# Patient Record
Sex: Female | Born: 1955 | Race: White | Hispanic: No | Marital: Married | Smoking: Never smoker
Health system: Southern US, Community
[De-identification: ages and names within clinical notes are randomized; demographics above are authoritative.]

## PROBLEM LIST (undated history)

## (undated) DIAGNOSIS — I341 Nonrheumatic mitral (valve) prolapse: Secondary | ICD-10-CM

## (undated) DIAGNOSIS — Z8619 Personal history of other infectious and parasitic diseases: Secondary | ICD-10-CM

## (undated) DIAGNOSIS — T7840XA Allergy, unspecified, initial encounter: Secondary | ICD-10-CM

## (undated) DIAGNOSIS — E785 Hyperlipidemia, unspecified: Secondary | ICD-10-CM

## (undated) DIAGNOSIS — I480 Paroxysmal atrial fibrillation: Secondary | ICD-10-CM

## (undated) DIAGNOSIS — M810 Age-related osteoporosis without current pathological fracture: Secondary | ICD-10-CM

## (undated) DIAGNOSIS — Z87440 Personal history of urinary (tract) infections: Secondary | ICD-10-CM

## (undated) DIAGNOSIS — Z124 Encounter for screening for malignant neoplasm of cervix: Secondary | ICD-10-CM

## (undated) DIAGNOSIS — E559 Vitamin D deficiency, unspecified: Secondary | ICD-10-CM

## (undated) DIAGNOSIS — I4891 Unspecified atrial fibrillation: Secondary | ICD-10-CM

## (undated) DIAGNOSIS — I77811 Abdominal aortic ectasia: Secondary | ICD-10-CM

## (undated) DIAGNOSIS — C44310 Basal cell carcinoma of skin of unspecified parts of face: Secondary | ICD-10-CM

## (undated) DIAGNOSIS — Z0000 Encounter for general adult medical examination without abnormal findings: Secondary | ICD-10-CM

## (undated) HISTORY — DX: Hyperlipidemia, unspecified: E78.5

## (undated) HISTORY — DX: Nonrheumatic mitral (valve) prolapse: I34.1

## (undated) HISTORY — PX: TONSILLECTOMY: SHX5217

## (undated) HISTORY — DX: Age-related osteoporosis without current pathological fracture: M81.0

## (undated) HISTORY — DX: Basal cell carcinoma of skin of unspecified parts of face: C44.310

## (undated) HISTORY — DX: Encounter for general adult medical examination without abnormal findings: Z00.00

## (undated) HISTORY — DX: Allergy, unspecified, initial encounter: T78.40XA

## (undated) HISTORY — DX: Abdominal aortic ectasia: I77.811

## (undated) HISTORY — DX: Personal history of other infectious and parasitic diseases: Z86.19

## (undated) HISTORY — DX: Morbid (severe) obesity due to excess calories: E66.01

## (undated) HISTORY — DX: Vitamin D deficiency, unspecified: E55.9

## (undated) HISTORY — DX: Personal history of urinary (tract) infections: Z87.440

## (undated) HISTORY — DX: Unspecified atrial fibrillation: I48.91

## (undated) HISTORY — DX: Paroxysmal atrial fibrillation: I48.0

## (undated) HISTORY — DX: Encounter for screening for malignant neoplasm of cervix: Z12.4

---

## 2014-07-05 ENCOUNTER — Other Ambulatory Visit: Payer: Self-pay | Admitting: Family Medicine

## 2014-07-05 DIAGNOSIS — Z1231 Encounter for screening mammogram for malignant neoplasm of breast: Secondary | ICD-10-CM

## 2014-07-09 ENCOUNTER — Ambulatory Visit: Payer: Self-pay | Admitting: Physician Assistant

## 2014-07-16 ENCOUNTER — Ambulatory Visit: Payer: Self-pay | Admitting: Physician Assistant

## 2014-07-16 ENCOUNTER — Ambulatory Visit (HOSPITAL_BASED_OUTPATIENT_CLINIC_OR_DEPARTMENT_OTHER): Payer: Self-pay

## 2014-07-30 ENCOUNTER — Ambulatory Visit (HOSPITAL_BASED_OUTPATIENT_CLINIC_OR_DEPARTMENT_OTHER)
Admission: RE | Admit: 2014-07-30 | Discharge: 2014-07-30 | Disposition: A | Discharge status: Home / Self Care | Payer: 59 | Source: Ambulatory Visit | Attending: Family Medicine | Admitting: Family Medicine

## 2014-07-30 DIAGNOSIS — Z1231 Encounter for screening mammogram for malignant neoplasm of breast: Secondary | ICD-10-CM | POA: Diagnosis present

## 2014-08-20 ENCOUNTER — Ambulatory Visit (INDEPENDENT_AMBULATORY_CARE_PROVIDER_SITE_OTHER): Payer: 59 | Admitting: Medical

## 2014-08-20 ENCOUNTER — Encounter: Payer: Self-pay | Admitting: Medical

## 2014-08-20 VITALS — BP 125/81 | HR 88 | Temp 98.1°F | Ht 63.5 in | Wt 110.6 lb

## 2014-08-20 DIAGNOSIS — J0100 Acute maxillary sinusitis, unspecified: Secondary | ICD-10-CM | POA: Diagnosis not present

## 2014-08-20 MED ORDER — CEFDINIR 300 MG PO CAPS: 300.0000 mg | ORAL_CAPSULE | Freq: Two times a day (BID) | ORAL | Status: DC

## 2014-08-20 MED ORDER — BENZONATATE 100 MG PO CAPS: 100.0000 mg | ORAL_CAPSULE | Freq: Three times a day (TID) | ORAL | Status: DC | PRN

## 2014-08-20 NOTE — Assessment & Plan Note (Signed)
Your appear to have a sinus infection following allergic rhinitis symptoms. I am prescribing antibiotic cefdinir  for the infection. To help with the nasal congestion continue flonase nasal steroid. For your associated cough, I prescribed cough medicine benzonatate.  Rest, hydrate, tylenol for fever.  Follow up in 7 days or as needed.

## 2014-08-20 NOTE — Progress Notes (Signed)
Subjective:    Patient ID: Catherine Haynes, female    DOB: August 27, 1955, 59 y.o.   MRN: [id number]  HPI  Pt has new patient appointment with Dr. Randel Pigg in May. They gave appointment today for acute visit.  Pt states no chronic medical problems for which she takes meds daily. Only mild elevated lipids in the past. Pt states tonsillectomy and c section only.  Pt in states nasal congestion for 2-3 weeks. Then this week low grade fever with sinus pressure. Some sneezing and itching eyes a lot on and off past 2 years. Sneezing and itching eyes fall and winter.Pt gets occasional sinus infections in the past.  Clean houses, exercise walks 5 days a week, non smoker, Married- 2 children.  Review of Systems  Constitutional: Positive for fever and chills. Negative for fatigue.  HENT: Positive for congestion, postnasal drip and sinus pressure. Negative for ear pain, sneezing, sore throat and trouble swallowing.   Respiratory: Negative for cough, chest tightness, wheezing and stridor.   Cardiovascular: Negative for chest pain and palpitations.  Musculoskeletal: Negative for back pain.  Neurological: Negative for dizziness, seizures, speech difficulty, light-headedness, numbness and headaches.  Hematological: Negative for adenopathy. Does not bruise/bleed easily.   Past Medical History  Diagnosis Date  . Allergy   . Hyperlipidemia   . Mitral valve prolapse     History   Social History  . Marital Status: Married    Spouse Name: N/A  . Number of Children: N/A  . Years of Education: N/A   Occupational History  . Not on file.   Social History Main Topics  . Smoking status: Never Smoker   . Smokeless tobacco: Never Used  . Alcohol Use: 0.0 oz/week    0 Standard drinks or equivalent per week     Comment: social.  . Drug Use: Not on file  . Sexual Activity: Not on file   Other Topics Concern  . Not on file   Social History Narrative  . No narrative on file    Past Surgical History   Procedure Laterality Date  . Cesarean section      Family History  Problem Relation Age of Onset  . Stroke Mother   . Hypertension Mother   . Stroke Father   . Hypertension Father   . Diabetes Father     No Known Allergies  No current outpatient prescriptions on file prior to visit.   No current facility-administered medications on file prior to visit.    BP 125/81 mmHg  Pulse 88  Temp(Src) 98.1 F (36.7 C) (Oral)  Ht 5' 3.5" (1.613 m)  Wt 110 lb 9.6 oz (50.168 kg)  BMI 19.28 kg/m2  SpO2 100%      Objective:   Physical Exam  General  Mental Status - Alert. General Appearance - Well groomed. Not in acute distress.  Skin Rashes- No Rashes.  HEENT Head- Normal. Ear Auditory Canal - Left- Normal. Right - Normal.Tympanic Membrane- Left- Normal. Right- Normal. Eye Sclera/Conjunctiva- Left- Normal. Right- Normal. Nose & Sinuses Nasal Mucosa- Left-  Boggy and Congested. Right-  Boggy and  Congested.Bilateral maxillary and frontal sinus pressure. Mouth & Throat Lips: Upper Lip- Normal: no dryness, cracking, pallor, cyanosis, or vesicular eruption. Lower Lip-Normal: no dryness, cracking, pallor, cyanosis or vesicular eruption. Buccal Mucosa- Bilateral- No Aphthous ulcers. Oropharynx- No Discharge or Erythema. Tonsils: Characteristics- Bilateral- No Erythema or Congestion. Size/Enlargement- Bilateral- No enlargement. Discharge- bilateral-None.  Neck Neck- Supple. No Masses.   Chest and Lung Exam  Auscultation: Breath Sounds:-Clear even and unlabored.  Cardiovascular Auscultation:Rythm- Regular, rate and rhythm. Murmurs & Other Heart Sounds:Ausculatation of the heart reveal- No Murmurs.  Lymphatic Head & Neck General Head & Neck Lymphatics: Bilateral: Description- No Localized lymphadenopathy.       Assessment & Plan:

## 2014-08-20 NOTE — Progress Notes (Signed)
Pre visit review using our clinic review tool, if applicable. No additional management support is needed unless otherwise documented below in the visit note. 

## 2014-08-20 NOTE — Patient Instructions (Signed)
Sinusitis, acute maxillary Your appear to have a sinus infection following allergic rhinitis symptoms. I am prescribing antibiotic cefdinir  for the infection. To help with the nasal congestion continue flonase nasal steroid. For your associated cough, I prescribed cough medicine benzonatate.  Rest, hydrate, tylenol for fever.  Follow up in 7 days or as needed.

## 2014-11-09 ENCOUNTER — Ambulatory Visit: Payer: Self-pay | Admitting: Family Medicine

## 2014-11-30 ENCOUNTER — Ambulatory Visit (INDEPENDENT_AMBULATORY_CARE_PROVIDER_SITE_OTHER): Payer: 59 | Admitting: Family Medicine

## 2014-11-30 ENCOUNTER — Encounter: Payer: Self-pay | Admitting: Family Medicine

## 2014-11-30 VITALS — BP 110/64 | HR 84 | Temp 98.1°F | Ht 63.0 in | Wt 111.5 lb

## 2014-11-30 DIAGNOSIS — E785 Hyperlipidemia, unspecified: Secondary | ICD-10-CM

## 2014-11-30 DIAGNOSIS — Z780 Asymptomatic menopausal state: Secondary | ICD-10-CM

## 2014-11-30 DIAGNOSIS — Z0000 Encounter for general adult medical examination without abnormal findings: Secondary | ICD-10-CM | POA: Diagnosis not present

## 2014-11-30 DIAGNOSIS — C44310 Basal cell carcinoma of skin of unspecified parts of face: Secondary | ICD-10-CM

## 2014-11-30 DIAGNOSIS — J0100 Acute maxillary sinusitis, unspecified: Secondary | ICD-10-CM

## 2014-11-30 DIAGNOSIS — M810 Age-related osteoporosis without current pathological fracture: Secondary | ICD-10-CM

## 2014-11-30 DIAGNOSIS — Z1211 Encounter for screening for malignant neoplasm of colon: Secondary | ICD-10-CM | POA: Diagnosis not present

## 2014-11-30 LAB — COMPREHENSIVE METABOLIC PANEL
ALT: 17 U/L (ref 0–35)
AST: 18 U/L (ref 0–37)
Albumin: 4.6 g/dL (ref 3.5–5.2)
Alkaline Phosphatase: 69 U/L (ref 39–117)
BUN: 9 mg/dL (ref 6–23)
CO2: 29 mEq/L (ref 19–32)
Calcium: 9.1 mg/dL (ref 8.4–10.5)
Chloride: 104 mEq/L (ref 96–112)
Creatinine, Ser: 0.55 mg/dL (ref 0.40–1.20)
GFR: 121.27 mL/min (ref >60.00)
Glucose, Bld: 88 mg/dL (ref 70–99)
Potassium: 3.7 mEq/L (ref 3.5–5.1)
Sodium: 138 mEq/L (ref 135–145)
Total Bilirubin: 0.4 mg/dL (ref 0.2–1.2)
Total Protein: 7.1 g/dL (ref 6.0–8.3)

## 2014-11-30 LAB — CBC
HCT: 41.5 % (ref 36.0–46.0)
Hemoglobin: 13.8 g/dL (ref 12.0–15.0)
MCHC: 33.3 g/dL (ref 30.0–36.0)
MCV: 91.1 fl (ref 78.0–100.0)
Platelets: 276 10*3/uL (ref 150.0–400.0)
RBC: 4.56 Mil/uL (ref 3.87–5.11)
RDW: 13.4 % (ref 11.5–15.5)
WBC: 5.6 10*3/uL (ref 4.0–10.5)

## 2014-11-30 LAB — LIPID PANEL
Cholesterol: 206 mg/dL — ABNORMAL HIGH (ref 0–200)
HDL: 83.4 mg/dL (ref >39.00)
LDL Cholesterol: 110 mg/dL — ABNORMAL HIGH (ref 0–99)
NonHDL: 122.6
Total CHOL/HDL Ratio: 2
Triglycerides: 61 mg/dL (ref 0.0–149.0)
VLDL: 12.2 mg/dL (ref 0.0–40.0)

## 2014-11-30 LAB — TSH: TSH: 1.93 u[IU]/mL (ref 0.35–4.50)

## 2014-11-30 NOTE — Progress Notes (Signed)
Pre visit review using our clinic review tool, if applicable. No additional management support is needed unless otherwise documented below in the visit note. 

## 2014-11-30 NOTE — Progress Notes (Signed)
Tenaya Hilyer  [id number] 19-Jun-1956 11/30/2014      Progress Note-Follow Up  Subjective  Chief Complaint  Chief Complaint  Patient presents with  . Establish Care    HPI  Patient is a 59 y.o. female in today for routine medical care. Patient is in today to establish care. She is recently been treated for sinus infection with good results. No congestion or fever. She generally feels well. Does report having a basal cell carcinoma on her face but no new or recent concerns. With the menopause at roughly 59 years of age. Follows with [facility]. Has not had a Pap since her kids were born in her daughter's 49. Has not had a colonoscopy thus far. No recent illness. Denies CP/palp/SOB/HA/congestion/fevers/GI or GU c/o. Taking meds as prescribed  Past Medical History  Diagnosis Date  . Allergy   . Hyperlipidemia   . Mitral valve prolapse     Past Surgical History  Procedure Laterality Date  . Cesarean section      Family History  Problem Relation Age of Onset  . Stroke Mother   . Hypertension Mother   . Stroke Father   . Hypertension Father   . Diabetes Father     History   Social History  . Marital Status: Married    Spouse Name: N/A  . Number of Children: N/A  . Years of Education: N/A   Occupational History  . clean houses     Social History Main Topics  . Smoking status: Never Smoker   . Smokeless tobacco: Never Used  . Alcohol Use: 0.0 oz/week    0 Standard drinks or equivalent per week     Comment: social.  . Drug Use: No  . Sexual Activity: Not on file   Other Topics Concern  . Not on file   Social History Narrative    No current outpatient prescriptions on file prior to visit.   No current facility-administered medications on file prior to visit.    No Known Allergies  Review of Systems  Review of Systems  Constitutional: Negative for fever, chills and malaise/fatigue.  HENT: Negative for congestion, hearing loss and  nosebleeds.   Eyes: Negative for discharge.  Respiratory: Negative for cough, sputum production, shortness of breath and wheezing.   Cardiovascular: Negative for chest pain, palpitations and leg swelling.  Gastrointestinal: Negative for heartburn, nausea, vomiting, abdominal pain, diarrhea, constipation and blood in stool.  Genitourinary: Negative for dysuria, urgency, frequency and hematuria.  Musculoskeletal: Negative for myalgias, back pain and falls.  Skin: Negative for rash.  Neurological: Negative for dizziness, tremors, sensory change, focal weakness, loss of consciousness, weakness and headaches.  Endo/Heme/Allergies: Negative for polydipsia. Does not bruise/bleed easily.  Psychiatric/Behavioral: Negative for depression and suicidal ideas. The patient is not nervous/anxious and does not have insomnia.     Objective  BP 110/64 mmHg  Pulse 84  Temp(Src) 98.1 F (36.7 C) (Oral)  Ht 5\' 3"  (1.6 m)  Wt 111 lb 8 oz (50.576 kg)  BMI 19.76 kg/m2  SpO2 97%  Physical Exam  Physical Exam  Constitutional: She is oriented to person, place, and time and well-developed, well-nourished, and in no distress. No distress.  HENT:  Head: Normocephalic and atraumatic.  Right Ear: External ear normal.  Left Ear: External ear normal.  Nose: Nose normal.  Mouth/Throat: Oropharynx is clear and moist. No oropharyngeal exudate.  Eyes: Conjunctivae are normal. Pupils are equal, round, and reactive to light. Right eye exhibits no discharge. Left  eye exhibits no discharge. No scleral icterus.  Neck: Normal range of motion. Neck supple. No thyromegaly present.  Cardiovascular: Normal rate, regular rhythm, normal heart sounds and intact distal pulses.   No murmur heard. Pulmonary/Chest: Effort normal and breath sounds normal. No respiratory distress. She has no wheezes. She has no rales.  Abdominal: Soft. Bowel sounds are normal. She exhibits no distension and no mass. There is no tenderness.    Musculoskeletal: Normal range of motion. She exhibits no edema or tenderness.  Lymphadenopathy:    She has no cervical adenopathy.  Neurological: She is alert and oriented to person, place, and time. She has normal reflexes. No cranial nerve deficit. Coordination normal.  Skin: Skin is warm and dry. No rash noted. She is not diaphoretic.  Psychiatric: Mood, memory and affect normal.    Assessment & Plan  Hx: UTI (urinary tract infection) No concerns recently  Hyperlipidemia Tolerating statin, encouraged heart healthy diet, avoid trans fats, minimize simple carbs and saturated fats. Increase exercise as tolerated  Osteoporosis Dexa scan confirms osteoporosis, encouraged citracal bid and/or 3 servings of dairy daily. Start Vitamin D 2000 IU daily and check vitamin D level with next blood draw. incresae exercise.   Colon cancer screening Referred to gastroenterology for screening colonoscopy  Sinusitis, acute maxillary Good response to recent course of antibiotics.

## 2014-11-30 NOTE — Patient Instructions (Addendum)
Cranberry tabs and probiotics such as Digestive Advantage, Hardin Negus colon health or online at [email or web address]. 10 strain Encouraged increased rest and hydration, add probiotics, zinc such as capsule at 50 mg, Coldeze or Xicam. Treat fevers as needed 64 oz clear fluids  Elderberry liquid or caps  Vitamin d 2000 IU daily   Preventive Care for Adults A healthy lifestyle and preventive care can promote health and wellness. Preventive health guidelines for women include the following key practices.  A routine yearly physical is a good way to check with your health care provider about your health and preventive screening. It is a chance to share any concerns and updates on your health and to receive a thorough exam.  Visit your dentist for a routine exam and preventive care every 6 months. Brush your teeth twice a day and floss once a day. Good oral hygiene prevents tooth decay and gum disease.  The frequency of eye exams is based on your age, health, family medical history, use of contact lenses, and other factors. Follow your health care provider's recommendations for frequency of eye exams.  Eat a healthy diet. Foods like vegetables, fruits, whole grains, low-fat dairy products, and lean protein foods contain the nutrients you need without too many calories. Decrease your intake of foods high in solid fats, added sugars, and salt. Eat the right amount of calories for you.Get information about a proper diet from your health care provider, if necessary.  Regular physical exercise is one of the most important things you can do for your health. Most adults should get at least 150 minutes of moderate-intensity exercise (any activity that increases your heart rate and causes you to sweat) each week. In addition, most adults need muscle-strengthening exercises on 2 or more days a week.  Maintain a healthy weight. The body mass index (BMI) is a screening tool to identify possible weight  problems. It provides an estimate of body fat based on height and weight. Your health care provider can find your BMI and can help you achieve or maintain a healthy weight.For adults 20 years and older:  A BMI below 18.5 is considered underweight.  A BMI of 18.5 to 24.9 is normal.  A BMI of 25 to 29.9 is considered overweight.  A BMI of 30 and above is considered obese.  Maintain normal blood lipids and cholesterol levels by exercising and minimizing your intake of saturated fat. Eat a balanced diet with plenty of fruit and vegetables. Blood tests for lipids and cholesterol should begin at age 64 and be repeated every 5 years. If your lipid or cholesterol levels are high, you are over 50, or you are at high risk for heart disease, you may need your cholesterol levels checked more frequently.Ongoing high lipid and cholesterol levels should be treated with medicines if diet and exercise are not working.  If you smoke, find out from your health care provider how to quit. If you do not use tobacco, do not start.  Lung cancer screening is recommended for adults aged 32-80 years who are at high risk for developing lung cancer because of a history of smoking. A yearly low-dose CT scan of the lungs is recommended for people who have at least a 30-pack-year history of smoking and are a current smoker or have quit within the past 15 years. A pack year of smoking is smoking an average of 1 pack of cigarettes a day for 1 year (for example: 1 pack a day for 30 years  or 2 packs a day for 15 years). Yearly screening should continue until the smoker has stopped smoking for at least 15 years. Yearly screening should be stopped for people who develop a health problem that would prevent them from having lung cancer treatment.  If you are pregnant, do not drink alcohol. If you are breastfeeding, be very cautious about drinking alcohol. If you are not pregnant and choose to drink alcohol, do not have more than 1 drink  per day. One drink is considered to be 12 ounces (355 mL) of beer, 5 ounces (148 mL) of wine, or 1.5 ounces (44 mL) of liquor.  Avoid use of street drugs. Do not share needles with anyone. Ask for help if you need support or instructions about stopping the use of drugs.  High blood pressure causes heart disease and increases the risk of stroke. Your blood pressure should be checked at least every 1 to 2 years. Ongoing high blood pressure should be treated with medicines if weight loss and exercise do not work.  If you are 39-64 years old, ask your health care provider if you should take aspirin to prevent strokes.  Diabetes screening involves taking a blood sample to check your fasting blood sugar level. This should be done once every 3 years, after age 3, if you are within normal weight and without risk factors for diabetes. Testing should be considered at a younger age or be carried out more frequently if you are overweight and have at least 1 risk factor for diabetes.  Breast cancer screening is essential preventive care for women. You should practice "breast self-awareness." This means understanding the normal appearance and feel of your breasts and may include breast self-examination. Any changes detected, no matter how small, should be reported to a health care provider. Women in their 12s and 30s should have a clinical breast exam (CBE) by a health care provider as part of a regular health exam every 1 to 3 years. After age 43, women should have a CBE every year. Starting at age 6, women should consider having a mammogram (breast X-ray test) every year. Women who have a family history of breast cancer should talk to their health care provider about genetic screening. Women at a high risk of breast cancer should talk to their health care providers about having an MRI and a mammogram every year.  Breast cancer gene (BRCA)-related cancer risk assessment is recommended for women who have family  members with BRCA-related cancers. BRCA-related cancers include breast, ovarian, tubal, and peritoneal cancers. Having family members with these cancers may be associated with an increased risk for harmful changes (mutations) in the breast cancer genes BRCA1 and BRCA2. Results of the assessment will determine the need for genetic counseling and BRCA1 and BRCA2 testing.  Routine pelvic exams to screen for cancer are no longer recommended for nonpregnant women who are considered low risk for cancer of the pelvic organs (ovaries, uterus, and vagina) and who do not have symptoms. Ask your health care provider if a screening pelvic exam is right for you.  If you have had past treatment for cervical cancer or a condition that could lead to cancer, you need Pap tests and screening for cancer for at least 20 years after your treatment. If Pap tests have been discontinued, your risk factors (such as having a new sexual partner) need to be reassessed to determine if screening should be resumed. Some women have medical problems that increase the chance of getting cervical  cancer. In these cases, your health care provider may recommend more frequent screening and Pap tests.  The HPV test is an additional test that may be used for cervical cancer screening. The HPV test looks for the virus that can cause the cell changes on the cervix. The cells collected during the Pap test can be tested for HPV. The HPV test could be used to screen women aged 76 years and older, and should be used in women of any age who have unclear Pap test results. After the age of 40, women should have HPV testing at the same frequency as a Pap test.  Colorectal cancer can be detected and often prevented. Most routine colorectal cancer screening begins at the age of 31 years and continues through age 85 years. However, your health care provider may recommend screening at an earlier age if you have risk factors for colon cancer. On a yearly basis,  your health care provider may provide home test kits to check for hidden blood in the stool. Use of a small camera at the end of a tube, to directly examine the colon (sigmoidoscopy or colonoscopy), can detect the earliest forms of colorectal cancer. Talk to your health care provider about this at age 91, when routine screening begins. Direct exam of the colon should be repeated every 5-10 years through age 59 years, unless early forms of pre-cancerous polyps or small growths are found.  People who are at an increased risk for hepatitis B should be screened for this virus. You are considered at high risk for hepatitis B if:  You were born in a country where hepatitis B occurs often. Talk with your health care provider about which countries are considered high risk.  Your parents were born in a high-risk country and you have not received a shot to protect against hepatitis B (hepatitis B vaccine).  You have HIV or AIDS.  You use needles to inject street drugs.  You live with, or have sex with, someone who has hepatitis B.  You get hemodialysis treatment.  You take certain medicines for conditions like cancer, organ transplantation, and autoimmune conditions.  Hepatitis C blood testing is recommended for all people born from [birth year] through 1963 and any individual with known risks for hepatitis C.  Practice safe sex. Use condoms and avoid high-risk sexual practices to reduce the spread of sexually transmitted infections (STIs). STIs include gonorrhea, chlamydia, syphilis, trichomonas, herpes, HPV, and human immunodeficiency virus (HIV). Herpes, HIV, and HPV are viral illnesses that have no cure. They can result in disability, cancer, and death.  You should be screened for sexually transmitted illnesses (STIs) including gonorrhea and chlamydia if:  You are sexually active and are younger than 24 years.  You are older than 24 years and your health care provider tells you that you are at risk for  this type of infection.  Your sexual activity has changed since you were last screened and you are at an increased risk for chlamydia or gonorrhea. Ask your health care provider if you are at risk.  If you are at risk of being infected with HIV, it is recommended that you take a prescription medicine daily to prevent HIV infection. This is called preexposure prophylaxis (PrEP). You are considered at risk if:  You are a heterosexual woman, are sexually active, and are at increased risk for HIV infection.  You take drugs by injection.  You are sexually active with a partner who has HIV.  Talk with your  health care provider about whether you are at high risk of being infected with HIV. If you choose to begin PrEP, you should first be tested for HIV. You should then be tested every 3 months for as long as you are taking PrEP.  Osteoporosis is a disease in which the bones lose minerals and strength with aging. This can result in serious bone fractures or breaks. The risk of osteoporosis can be identified using a bone density scan. Women ages 75 years and over and women at risk for fractures or osteoporosis should discuss screening with their health care providers. Ask your health care provider whether you should take a calcium supplement or vitamin D to reduce the rate of osteoporosis.  Menopause can be associated with physical symptoms and risks. Hormone replacement therapy is available to decrease symptoms and risks. You should talk to your health care provider about whether hormone replacement therapy is right for you.  Use sunscreen. Apply sunscreen liberally and repeatedly throughout the day. You should seek shade when your shadow is shorter than you. Protect yourself by wearing long sleeves, pants, a wide-brimmed hat, and sunglasses year round, whenever you are outdoors.  Once a month, do a whole body skin exam, using a mirror to look at the skin on your back. Tell your health care provider of  new moles, moles that have irregular borders, moles that are larger than a pencil eraser, or moles that have changed in shape or color.  Stay current with required vaccines (immunizations).  Influenza vaccine. All adults should be immunized every year.  Tetanus, diphtheria, and acellular pertussis (Td, Tdap) vaccine. Pregnant women should receive 1 dose of Tdap vaccine during each pregnancy. The dose should be obtained regardless of the length of time since the last dose. Immunization is preferred during the 27th-36th week of gestation. An adult who has not previously received Tdap or who does not know her vaccine status should receive 1 dose of Tdap. This initial dose should be followed by tetanus and diphtheria toxoids (Td) booster doses every 10 years. Adults with an unknown or incomplete history of completing a 3-dose immunization series with Td-containing vaccines should begin or complete a primary immunization series including a Tdap dose. Adults should receive a Td booster every 10 years.  Varicella vaccine. An adult without evidence of immunity to varicella should receive 2 doses or a second dose if she has previously received 1 dose. Pregnant females who do not have evidence of immunity should receive the first dose after pregnancy. This first dose should be obtained before leaving the health care facility. The second dose should be obtained 4-8 weeks after the first dose.  Human papillomavirus (HPV) vaccine. Females aged 13-26 years who have not received the vaccine previously should obtain the 3-dose series. The vaccine is not recommended for use in pregnant females. However, pregnancy testing is not needed before receiving a dose. If a female is found to be pregnant after receiving a dose, no treatment is needed. In that case, the remaining doses should be delayed until after the pregnancy. Immunization is recommended for any person with an immunocompromised condition through the age of 39  years if she did not get any or all doses earlier. During the 3-dose series, the second dose should be obtained 4-8 weeks after the first dose. The third dose should be obtained 24 weeks after the first dose and 16 weeks after the second dose.  Zoster vaccine. One dose is recommended for adults aged 71 years  or older unless certain conditions are present.  Measles, mumps, and rubella (MMR) vaccine. Adults born before 45 generally are considered immune to measles and mumps. Adults born in [birth year] or later should have 1 or more doses of MMR vaccine unless there is a contraindication to the vaccine or there is laboratory evidence of immunity to each of the three diseases. A routine second dose of MMR vaccine should be obtained at least 28 days after the first dose for students attending postsecondary schools, health care workers, or international travelers. People who received inactivated measles vaccine or an unknown type of measles vaccine during 1963-1967 should receive 2 doses of MMR vaccine. People who received inactivated mumps vaccine or an unknown type of mumps vaccine before 1979 and are at high risk for mumps infection should consider immunization with 2 doses of MMR vaccine. For females of childbearing age, rubella immunity should be determined. If there is no evidence of immunity, females who are not pregnant should be vaccinated. If there is no evidence of immunity, females who are pregnant should delay immunization until after pregnancy. Unvaccinated health care workers born before [birth year] who lack laboratory evidence of measles, mumps, or rubella immunity or laboratory confirmation of disease should consider measles and mumps immunization with 2 doses of MMR vaccine or rubella immunization with 1 dose of MMR vaccine.  Pneumococcal 13-valent conjugate (PCV13) vaccine. When indicated, a person who is uncertain of her immunization history and has no record of immunization should receive the PCV13 vaccine.  An adult aged 26 years or older who has certain medical conditions and has not been previously immunized should receive 1 dose of PCV13 vaccine. This PCV13 should be followed with a dose of pneumococcal polysaccharide (PPSV23) vaccine. The PPSV23 vaccine dose should be obtained at least 8 weeks after the dose of PCV13 vaccine. An adult aged 32 years or older who has certain medical conditions and previously received 1 or more doses of PPSV23 vaccine should receive 1 dose of PCV13. The PCV13 vaccine dose should be obtained 1 or more years after the last PPSV23 vaccine dose.  Pneumococcal polysaccharide (PPSV23) vaccine. When PCV13 is also indicated, PCV13 should be obtained first. All adults aged 15 years and older should be immunized. An adult younger than age 36 years who has certain medical conditions should be immunized. Any person who resides in a nursing home or long-term care facility should be immunized. An adult smoker should be immunized. People with an immunocompromised condition and certain other conditions should receive both PCV13 and PPSV23 vaccines. People with human immunodeficiency virus (HIV) infection should be immunized as soon as possible after diagnosis. Immunization during chemotherapy or radiation therapy should be avoided. Routine use of PPSV23 vaccine is not recommended for American Indians, [place] Natives, or people younger than 65 years unless there are medical conditions that require PPSV23 vaccine. When indicated, people who have unknown immunization and have no record of immunization should receive PPSV23 vaccine. One-time revaccination 5 years after the first dose of PPSV23 is recommended for people aged 19-64 years who have chronic kidney failure, nephrotic syndrome, asplenia, or immunocompromised conditions. People who received 1-2 doses of PPSV23 before age 34 years should receive another dose of PPSV23 vaccine at age 59 years or later if at least 5 years have passed since the  previous dose. Doses of PPSV23 are not needed for people immunized with PPSV23 at or after age 52 years.  Meningococcal vaccine. Adults with asplenia or persistent complement component deficiencies should receive  2 doses of quadrivalent meningococcal conjugate (MenACWY-D) vaccine. The doses should be obtained at least 2 months apart. Microbiologists working with certain meningococcal bacteria, [place] recruits, people at risk during an outbreak, and people who travel to or live in countries with a high rate of meningitis should be immunized. A first-year college student up through age 53 years who is living in a residence hall should receive a dose if she did not receive a dose on or after her 16th birthday. Adults who have certain high-risk conditions should receive one or more doses of vaccine.  Hepatitis A vaccine. Adults who wish to be protected from this disease, have certain high-risk conditions, work with hepatitis A-infected animals, work in hepatitis A research labs, or travel to or work in countries with a high rate of hepatitis A should be immunized. Adults who were previously unvaccinated and who anticipate close contact with an international adoptee during the first 60 days after arrival in the [place] from a country with a high rate of hepatitis A should be immunized.  Hepatitis B vaccine. Adults who wish to be protected from this disease, have certain high-risk conditions, may be exposed to blood or other infectious body fluids, are household contacts or sex partners of hepatitis B positive people, are clients or workers in certain care facilities, or travel to or work in countries with a high rate of hepatitis B should be immunized.  Haemophilus influenzae type b (Hib) vaccine. A previously unvaccinated person with asplenia or sickle cell disease or having a scheduled splenectomy should receive 1 dose of Hib vaccine. Regardless of previous immunization, a recipient of a hematopoietic  stem cell transplant should receive a 3-dose series 6-12 months after her successful transplant. Hib vaccine is not recommended for adults with HIV infection. Preventive Services / Frequency Ages 33 to 53 years  Blood pressure check.** / Every 1 to 2 years.  Lipid and cholesterol check.** / Every 5 years beginning at age 29.  Clinical breast exam.** / Every 3 years for women in their 90s and 31s.  BRCA-related cancer risk assessment.** / For women who have family members with a BRCA-related cancer (breast, ovarian, tubal, or peritoneal cancers).  Pap test.** / Every 2 years from ages 86 through 56. Every 3 years starting at age 33 through age 47 or 37 with a history of 3 consecutive normal Pap tests.  HPV screening.** / Every 3 years from ages 58 through ages 52 to 54 with a history of 3 consecutive normal Pap tests.  Hepatitis C blood test.** / For any individual with known risks for hepatitis C.  Skin self-exam. / Monthly.  Influenza vaccine. / Every year.  Tetanus, diphtheria, and acellular pertussis (Tdap, Td) vaccine.** / Consult your health care provider. Pregnant women should receive 1 dose of Tdap vaccine during each pregnancy. 1 dose of Td every 10 years.  Varicella vaccine.** / Consult your health care provider. Pregnant females who do not have evidence of immunity should receive the first dose after pregnancy.  HPV vaccine. / 3 doses over 6 months, if 30 and younger. The vaccine is not recommended for use in pregnant females. However, pregnancy testing is not needed before receiving a dose.  Measles, mumps, rubella (MMR) vaccine.** / You need at least 1 dose of MMR if you were born in 1957 or later. You may also need a 2nd dose. For females of childbearing age, rubella immunity should be determined. If there is no evidence of immunity, females who are  not pregnant should be vaccinated. If there is no evidence of immunity, females who are pregnant should delay immunization until  after pregnancy.  Pneumococcal 13-valent conjugate (PCV13) vaccine.** / Consult your health care provider.  Pneumococcal polysaccharide (PPSV23) vaccine.** / 1 to 2 doses if you smoke cigarettes or if you have certain conditions.  Meningococcal vaccine.** / 1 dose if you are age 40 to 44 years and a Market researcher living in a residence hall, or have one of several medical conditions, you need to get vaccinated against meningococcal disease. You may also need additional booster doses.  Hepatitis A vaccine.** / Consult your health care provider.  Hepatitis B vaccine.** / Consult your health care provider.  Haemophilus influenzae type b (Hib) vaccine.** / Consult your health care provider. Ages 53 to 84 years  Blood pressure check.** / Every 1 to 2 years.  Lipid and cholesterol check.** / Every 5 years beginning at age 75 years.  Lung cancer screening. / Every year if you are aged 22-80 years and have a 30-pack-year history of smoking and currently smoke or have quit within the past 15 years. Yearly screening is stopped once you have quit smoking for at least 15 years or develop a health problem that would prevent you from having lung cancer treatment.  Clinical breast exam.** / Every year after age 40 years.  BRCA-related cancer risk assessment.** / For women who have family members with a BRCA-related cancer (breast, ovarian, tubal, or peritoneal cancers).  Mammogram.** / Every year beginning at age 24 years and continuing for as long as you are in good health. Consult with your health care provider.  Pap test.** / Every 3 years starting at age 56 years through age 29 or 97 years with a history of 3 consecutive normal Pap tests.  HPV screening.** / Every 3 years from ages 21 years through ages 63 to 2 years with a history of 3 consecutive normal Pap tests.  Fecal occult blood test (FOBT) of stool. / Every year beginning at age 61 years and continuing until age 28 years.  You may not need to do this test if you get a colonoscopy every 10 years.  Flexible sigmoidoscopy or colonoscopy.** / Every 5 years for a flexible sigmoidoscopy or every 10 years for a colonoscopy beginning at age 28 years and continuing until age 47 years.  Hepatitis C blood test.** / For all people born from [birth year] through 1963 and any individual with known risks for hepatitis C.  Skin self-exam. / Monthly.  Influenza vaccine. / Every year.  Tetanus, diphtheria, and acellular pertussis (Tdap/Td) vaccine.** / Consult your health care provider. Pregnant women should receive 1 dose of Tdap vaccine during each pregnancy. 1 dose of Td every 10 years.  Varicella vaccine.** / Consult your health care provider. Pregnant females who do not have evidence of immunity should receive the first dose after pregnancy.  Zoster vaccine.** / 1 dose for adults aged 57 years or older.  Measles, mumps, rubella (MMR) vaccine.** / You need at least 1 dose of MMR if you were born in 1957 or later. You may also need a 2nd dose. For females of childbearing age, rubella immunity should be determined. If there is no evidence of immunity, females who are not pregnant should be vaccinated. If there is no evidence of immunity, females who are pregnant should delay immunization until after pregnancy.  Pneumococcal 13-valent conjugate (PCV13) vaccine.** / Consult your health care provider.  Pneumococcal polysaccharide (PPSV23) vaccine.** /  1 to 2 doses if you smoke cigarettes or if you have certain conditions.  Meningococcal vaccine.** / Consult your health care provider.  Hepatitis A vaccine.** / Consult your health care provider.  Hepatitis B vaccine.** / Consult your health care provider.  Haemophilus influenzae type b (Hib) vaccine.** / Consult your health care provider. Ages 58 years and over  Blood pressure check.** / Every 1 to 2 years.  Lipid and cholesterol check.** / Every 5 years beginning at age 55  years.  Lung cancer screening. / Every year if you are aged 2-80 years and have a 30-pack-year history of smoking and currently smoke or have quit within the past 15 years. Yearly screening is stopped once you have quit smoking for at least 15 years or develop a health problem that would prevent you from having lung cancer treatment.  Clinical breast exam.** / Every year after age 42 years.  BRCA-related cancer risk assessment.** / For women who have family members with a BRCA-related cancer (breast, ovarian, tubal, or peritoneal cancers).  Mammogram.** / Every year beginning at age 6 years and continuing for as long as you are in good health. Consult with your health care provider.  Pap test.** / Every 3 years starting at age 14 years through age 54 or 87 years with 3 consecutive normal Pap tests. Testing can be stopped between 65 and 70 years with 3 consecutive normal Pap tests and no abnormal Pap or HPV tests in the past 10 years.  HPV screening.** / Every 3 years from ages 66 years through ages 82 or 40 years with a history of 3 consecutive normal Pap tests. Testing can be stopped between 65 and 70 years with 3 consecutive normal Pap tests and no abnormal Pap or HPV tests in the past 10 years.  Fecal occult blood test (FOBT) of stool. / Every year beginning at age 63 years and continuing until age 58 years. You may not need to do this test if you get a colonoscopy every 10 years.  Flexible sigmoidoscopy or colonoscopy.** / Every 5 years for a flexible sigmoidoscopy or every 10 years for a colonoscopy beginning at age 36 years and continuing until age 52 years.  Hepatitis C blood test.** / For all people born from [birth year] through 1963 and any individual with known risks for hepatitis C.  Osteoporosis screening.** / A one-time screening for women ages 21 years and over and women at risk for fractures or osteoporosis.  Skin self-exam. / Monthly.  Influenza vaccine. / Every year.  Tetanus,  diphtheria, and acellular pertussis (Tdap/Td) vaccine.** / 1 dose of Td every 10 years.  Varicella vaccine.** / Consult your health care provider.  Zoster vaccine.** / 1 dose for adults aged 82 years or older.  Pneumococcal 13-valent conjugate (PCV13) vaccine.** / Consult your health care provider.  Pneumococcal polysaccharide (PPSV23) vaccine.** / 1 dose for all adults aged 38 years and older.  Meningococcal vaccine.** / Consult your health care provider.  Hepatitis A vaccine.** / Consult your health care provider.  Hepatitis B vaccine.** / Consult your health care provider.  Haemophilus influenzae type b (Hib) vaccine.** / Consult your health care provider. ** Family history and personal history of risk and conditions may change your health care provider's recommendations. Document Released: 08/07/2001 Document Revised: 10/26/2013 Document Reviewed: 11/06/2010 [facility] Patient Information 2015 [place], [place]. This information is not intended to replace advice given to you by your health care provider. Make sure you discuss any questions you have with  your health care provider.

## 2014-12-10 ENCOUNTER — Ambulatory Visit (HOSPITAL_BASED_OUTPATIENT_CLINIC_OR_DEPARTMENT_OTHER)
Admission: RE | Admit: 2014-12-10 | Discharge: 2014-12-10 | Disposition: A | Discharge status: Home / Self Care | Payer: 59 | Source: Ambulatory Visit | Attending: Family Medicine | Admitting: Family Medicine

## 2014-12-10 DIAGNOSIS — Z780 Asymptomatic menopausal state: Secondary | ICD-10-CM

## 2014-12-11 ENCOUNTER — Encounter: Payer: Self-pay | Admitting: Family Medicine

## 2014-12-11 DIAGNOSIS — I4891 Unspecified atrial fibrillation: Secondary | ICD-10-CM

## 2014-12-11 DIAGNOSIS — Z1211 Encounter for screening for malignant neoplasm of colon: Secondary | ICD-10-CM | POA: Insufficient documentation

## 2014-12-11 DIAGNOSIS — T7840XA Allergy, unspecified, initial encounter: Secondary | ICD-10-CM | POA: Insufficient documentation

## 2014-12-11 DIAGNOSIS — E785 Hyperlipidemia, unspecified: Secondary | ICD-10-CM | POA: Insufficient documentation

## 2014-12-11 DIAGNOSIS — C44310 Basal cell carcinoma of skin of unspecified parts of face: Secondary | ICD-10-CM

## 2014-12-11 DIAGNOSIS — M810 Age-related osteoporosis without current pathological fracture: Secondary | ICD-10-CM

## 2014-12-11 DIAGNOSIS — I77811 Abdominal aortic ectasia: Secondary | ICD-10-CM

## 2014-12-11 DIAGNOSIS — I480 Paroxysmal atrial fibrillation: Secondary | ICD-10-CM

## 2014-12-11 DIAGNOSIS — I341 Nonrheumatic mitral (valve) prolapse: Secondary | ICD-10-CM | POA: Insufficient documentation

## 2014-12-11 DIAGNOSIS — Z87440 Personal history of urinary (tract) infections: Secondary | ICD-10-CM

## 2014-12-11 HISTORY — DX: Unspecified atrial fibrillation: I48.91

## 2014-12-11 HISTORY — DX: Abdominal aortic ectasia: I77.811

## 2014-12-11 HISTORY — DX: Age-related osteoporosis without current pathological fracture: M81.0

## 2014-12-11 HISTORY — DX: Basal cell carcinoma of skin of unspecified parts of face: C44.310

## 2014-12-11 HISTORY — DX: Morbid (severe) obesity due to excess calories: E66.01

## 2014-12-11 HISTORY — DX: Paroxysmal atrial fibrillation: I48.0

## 2014-12-11 HISTORY — DX: Personal history of urinary (tract) infections: Z87.440

## 2014-12-11 NOTE — Assessment & Plan Note (Signed)
Tolerating statin, encouraged heart healthy diet, avoid trans fats, minimize simple carbs and saturated fats. Increase exercise as tolerated 

## 2014-12-11 NOTE — Assessment & Plan Note (Signed)
Referred to gastroenterology for screening colonoscopy

## 2014-12-11 NOTE — Assessment & Plan Note (Signed)
Good response to recent course of antibiotics.

## 2014-12-11 NOTE — Assessment & Plan Note (Signed)
No concerns recently

## 2014-12-11 NOTE — Assessment & Plan Note (Signed)
Dexa scan confirms osteoporosis, encouraged citracal bid and/or 3 servings of dairy daily. Start Vitamin D 2000 IU daily and check vitamin D level with next blood draw. incresae exercise.

## 2014-12-16 ENCOUNTER — Encounter: Payer: Self-pay | Admitting: Family Medicine

## 2014-12-16 ENCOUNTER — Ambulatory Visit (INDEPENDENT_AMBULATORY_CARE_PROVIDER_SITE_OTHER): Payer: 59 | Admitting: Family Medicine

## 2014-12-16 VITALS — BP 132/82 | HR 77 | Temp 98.4°F | Ht 63.0 in | Wt 110.2 lb

## 2014-12-16 DIAGNOSIS — M810 Age-related osteoporosis without current pathological fracture: Secondary | ICD-10-CM

## 2014-12-16 DIAGNOSIS — E559 Vitamin D deficiency, unspecified: Secondary | ICD-10-CM

## 2014-12-16 DIAGNOSIS — E785 Hyperlipidemia, unspecified: Secondary | ICD-10-CM

## 2014-12-16 NOTE — Progress Notes (Signed)
Pre visit review using our clinic review tool, if applicable. No additional management support is needed unless otherwise documented below in the visit note. 

## 2014-12-16 NOTE — Patient Instructions (Signed)
Increase weight bearing exercise with arms and legs Calcium Citrate once to twice daily and at least a total of 3 servings of calcium daily  Vitamin D 2000 IU daily  Osteoporosis Throughout your life, your body breaks down old bone and replaces it with new bone. As you get older, your body does not replace bone as quickly as it breaks it down. By the age of 42 years, most people begin to gradually lose bone because of the imbalance between bone loss and replacement. Some people lose more bone than others. Bone loss beyond a specified normal degree is considered osteoporosis.  Osteoporosis affects the strength and durability of your bones. The inside of the ends of your bones and your flat bones, like the bones of your pelvis, look like honeycomb, filled with tiny open spaces. As bone loss occurs, your bones become less dense. This means that the open spaces inside your bones become bigger and the walls between these spaces become thinner. This makes your bones weaker. Bones of a person with osteoporosis can become so weak that they can break (fracture) during minor accidents, such as a simple fall. CAUSES  The following factors have been associated with the development of osteoporosis:  Smoking.  Drinking more than 2 alcoholic drinks several days per week.  Long-term use of certain medicines:  Corticosteroids.  Chemotherapy medicines.  Thyroid medicines.  Antiepileptic medicines.  Gonadal hormone suppression medicine.  Immunosuppression medicine.  Being underweight.  Lack of physical activity.  Lack of exposure to the sun. This can lead to vitamin D deficiency.  Certain medical conditions:  Certain inflammatory bowel diseases, such as Crohn disease and ulcerative colitis.  Diabetes.  Hyperthyroidism.  Hyperparathyroidism. RISK FACTORS Anyone can develop osteoporosis. However, the following factors can increase your risk of developing osteoporosis:  Gender--Women are at  higher risk than men.  Age--Being older than 50 years increases your risk.  Ethnicity--White and Asian people have an increased risk.  Weight --Being extremely underweight can increase your risk of osteoporosis.  Family history of osteoporosis--Having a family member who has developed osteoporosis can increase your risk. SYMPTOMS  Usually, people with osteoporosis have no symptoms.  DIAGNOSIS  Signs during a physical exam that may prompt your caregiver to suspect osteoporosis include:  Decreased height. This is usually caused by the compression of the bones that form your spine (vertebrae) because they have weakened and become fractured.  A curving or rounding of the upper back (kyphosis). To confirm signs of osteoporosis, your caregiver may request a procedure that uses 2 low-dose X-ray beams with different levels of energy to measure your bone mineral density (dual-energy X-ray absorptiometry [DXA]). Also, your caregiver may check your level of vitamin D. TREATMENT  The goal of osteoporosis treatment is to strengthen bones in order to decrease the risk of bone fractures. There are different types of medicines available to help achieve this goal. Some of these medicines work by slowing the processes of bone loss. Some medicines work by increasing bone density. Treatment also involves making sure that your levels of calcium and vitamin D are adequate. PREVENTION  There are things you can do to help prevent osteoporosis. Adequate intake of calcium and vitamin D can help you achieve optimal bone mineral density. Regular exercise can also help, especially resistance and weight-bearing activities. If you smoke, quitting smoking is an important part of osteoporosis prevention. MAKE SURE YOU:  Understand these instructions.  Will watch your condition.  Will get help right away if you  are not doing well or get worse. FOR MORE INFORMATION www.osteo.org and Equipment[employer]ly.com.ee Document Released:  03/21/2005 Document Revised: 10/06/2012 Document Reviewed: 05/26/2011 [facility] Patient Information 2015 [place], [place]. This information is not intended to replace advice given to you by your health care provider. Make sure you discuss any questions you have with your health care provider.

## 2014-12-17 LAB — MAGNESIUM: Magnesium: 2.1 mg/dL (ref 1.5–2.5)

## 2014-12-17 LAB — VITAMIN D 25 HYDROXY (VIT D DEFICIENCY, FRACTURES): VITD: 16.23 ng/mL — ABNORMAL LOW (ref 30.00–100.00)

## 2014-12-20 ENCOUNTER — Other Ambulatory Visit: Payer: Self-pay | Admitting: Family Medicine

## 2014-12-20 MED ORDER — ERGOCALCIFEROL 1.25 MG (50000 UT) PO CAPS
50000.0000 [IU] | ORAL_CAPSULE | ORAL | Status: DC
Start: 2014-12-20 — End: 2015-04-08

## 2014-12-29 ENCOUNTER — Encounter: Payer: Self-pay | Admitting: Family Medicine

## 2014-12-29 DIAGNOSIS — E559 Vitamin D deficiency, unspecified: Secondary | ICD-10-CM

## 2014-12-29 HISTORY — DX: Vitamin D deficiency, unspecified: E55.9

## 2014-12-29 NOTE — Assessment & Plan Note (Signed)
Started on Vitamin D 50000 IU q week and 2000 IU daily

## 2014-12-29 NOTE — Progress Notes (Signed)
follows a well social  Catherine Haynes  [id number] 1956-03-14 12/29/2014      Progress Note-Follow Up  Subjective  Chief Complaint  Chief Complaint  Patient presents with  . Results    bone density    HPI  Patient is a 59 y.o. female in today for routine medical care.    Patient is in today to discuss her bone density results. Unfortunately her bone density showed notable osteoporosis. She is understandably upset and trying to decide what she would do with her options. She does exercise regularly but has a very strong family history of osteoporosis including her mother and her grandmother. Her mother currently is using reclast. Patient underwent menopause at age 71. She acknowledges she does not take a significant amount of calcium in each day. Denies CP/palp/SOB/HA/congestion/fevers/GI or GU c/o. Taking meds as prescribed Past Medical History  Diagnosis Date  . Mitral valve prolapse   . Hyperlipidemia   . Allergy   . Morbid obesity 12/11/2014  . Abdominal aortic ectasia 12/11/2014  . Atrial fibrillation 12/11/2014  . Paroxysmal atrial fibrillation 12/11/2014  . BCC (basal cell carcinoma), face 12/11/2014  . Hx: UTI (urinary tract infection) 12/11/2014  . Osteoporosis 12/11/2014  .  social 12/29/2014  . Vitamin D deficiency 12/29/2014    Past Surgical History  Procedure Laterality Date  . Cesarean section    . Tonsillectomy      Family History  Problem Relation Age of Onset  . Stroke Mother     TIA  . Hypertension Mother   . Hyperlipidemia Mother   . Diabetes Mother     diet controlled  . Osteoporosis Mother   . Stroke Father   . Hypertension Father   . Diabetes Father   . Hyperlipidemia Father   . Mental illness Sister 26    suicide  . Heart disease Maternal Grandmother     chf  . Osteoporosis Maternal Grandmother   . Alcohol abuse Maternal Grandfather   . Cancer Paternal Grandmother   . Diabetes Paternal Grandmother   . Heart disease Paternal Grandfather      History   Social History  . Marital Status: Married    Spouse Name: N/A  . Number of Children: N/A  . Years of Education: N/A   Occupational History  . clean houses     Social History Main Topics  . Smoking status: Never Smoker   . Smokeless tobacco: Never Used  . Alcohol Use: 0.0 oz/week    0 Standard drinks or equivalent per week     Comment: social.  . Drug Use: No  . 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 Not on file   Social History Narrative    No current outpatient prescriptions on file prior to visit.   No current facility-administered medications on file prior to visit.    No Known Allergies  Review of Systems  Review of Systems  Constitutional: Negative for fever and malaise/fatigue.  HENT: Negative for congestion.   Eyes: Negative for discharge.  Respiratory: Negative for shortness of breath.   Cardiovascular: Negative for chest pain, palpitations and leg swelling.  Gastrointestinal: Negative for nausea, abdominal pain and diarrhea.  Genitourinary: Negative for dysuria.  Musculoskeletal: Negative for falls.  Skin: Negative for rash.  Neurological: Negative for loss of consciousness and headaches.  Endo/Heme/Allergies: Negative for polydipsia.  Psychiatric/Behavioral: Negative for depression and suicidal  ideas. The patient is nervous/anxious. The patient does not have insomnia.     Objective  BP 132/82 mmHg  Pulse 77  Temp(Src) 98.4 F (36.9 C) (Oral)  Ht 5\' 3"  (1.6 m)  Wt 110 lb 4 oz (50.009 kg)  BMI 19.53 kg/m2  SpO2 98%  Physical Exam  Physical Exam  Constitutional: She is oriented to person, place, and time and well-developed, well-nourished, and in no distress. No distress.  HENT:  Head: Normocephalic and atraumatic.  Eyes: Conjunctivae are normal.  Neck: Neck supple. No thyromegaly present.  Cardiovascular: Normal rate,  regular rhythm and normal heart sounds.   No murmur heard. Pulmonary/Chest: Effort normal and breath sounds normal. She has no wheezes.  Abdominal: She exhibits no distension and no mass.  Musculoskeletal: She exhibits no edema.  Lymphadenopathy:    She has no cervical adenopathy.  Neurological: She is alert and oriented to person, place, and time.  Skin: Skin is warm and dry. No rash noted. She is not diaphoretic.  Psychiatric: Memory, affect and judgment normal.  Vitals reviewed.   Lab Results  Component Value Date   TSH 1.93 11/30/2014   Lab Results  Component Value Date   WBC 5.6 11/30/2014   HGB 13.8 11/30/2014   HCT 41.5 11/30/2014   MCV 91.1 11/30/2014   PLT 276.0 11/30/2014   Lab Results  Component Value Date   CREATININE 0.55 11/30/2014   BUN 9 11/30/2014   NA 138 11/30/2014   K 3.7 11/30/2014   CL 104 11/30/2014   CO2 29 11/30/2014   Lab Results  Component Value Date   ALT 17 11/30/2014   AST 18 11/30/2014   ALKPHOS 69 11/30/2014   BILITOT 0.4 11/30/2014   Lab Results  Component Value Date   CHOL 206* 11/30/2014   Lab Results  Component Value Date   HDL 83.40 11/30/2014   Lab Results  Component Value Date   LDLCALC 110* 11/30/2014   Lab Results  Component Value Date   TRIG 61.0 11/30/2014   Lab Results  Component Value Date   CHOLHDL 2 11/30/2014     Assessment & Plan  Osteoporosis New onset, patient very upset. She agrees to increased exercise. Check vitamin d, maintain 2000 IU daily and calcium 3 servings daily. She declines meds for now.  Hyperlipidemia Encouraged heart healthy diet, increase exercise, avoid trans fats, consider a krill oil cap daily  Vitamin D deficiency Started on Vitamin D 50000 IU q week and 2000 IU daily

## 2014-12-29 NOTE — Assessment & Plan Note (Signed)
New onset, patient very upset. She agrees to increased exercise. Check vitamin d, maintain 2000 IU daily and calcium 3 servings daily. She declines meds for now.

## 2014-12-29 NOTE — Assessment & Plan Note (Signed)
Encouraged heart healthy diet, increase exercise, avoid trans fats, consider a krill oil cap daily 

## 2015-03-29 ENCOUNTER — Ambulatory Visit (INDEPENDENT_AMBULATORY_CARE_PROVIDER_SITE_OTHER): Payer: BLUE CROSS/BLUE SHIELD | Admitting: Family Medicine

## 2015-03-29 VITALS — BP 108/72 | HR 82 | Temp 97.8°F | Wt 110.0 lb

## 2015-03-29 DIAGNOSIS — B029 Zoster without complications: Secondary | ICD-10-CM | POA: Diagnosis not present

## 2015-03-29 MED ORDER — HYDROCODONE-ACETAMINOPHEN 5-325 MG PO TABS: 1.0000 | ORAL_TABLET | Freq: Four times a day (QID) | ORAL | Status: DC | PRN

## 2015-03-29 MED ORDER — VALACYCLOVIR HCL 1 G PO TABS: 1000.0000 mg | ORAL_TABLET | Freq: Three times a day (TID) | ORAL | Status: DC

## 2015-03-29 NOTE — Progress Notes (Signed)
Patient ID: Catherine Haynes, female    DOB: December 01, 1955  Age: 59 y.o. MRN: [id number]    Subjective:  Subjective HPI Catherine Haynes presents c/o rash R flank ---it started with pain in the area then day later she started with blisters.   Review of Systems  Constitutional: Negative for diaphoresis, appetite change, fatigue and unexpected weight change.  Eyes: Negative for pain, redness and visual disturbance.  Respiratory: Negative for cough, chest tightness, shortness of breath and wheezing.   Cardiovascular: Negative for chest pain, palpitations and leg swelling.  Endocrine: Negative for cold intolerance, heat intolerance, polydipsia, polyphagia and polyuria.  Genitourinary: Negative for dysuria, frequency and difficulty urinating.  Skin: Positive for rash.  Neurological: Negative for dizziness, light-headedness, numbness and headaches.    History Past Medical History  Diagnosis Date  . Mitral valve prolapse   . Hyperlipidemia   . Allergy   . Morbid obesity 12/11/2014  . Abdominal aortic ectasia 12/11/2014  . Atrial fibrillation 12/11/2014  . Paroxysmal atrial fibrillation 12/11/2014  . BCC (basal cell carcinoma), face 12/11/2014  . Hx: UTI (urinary tract infection) 12/11/2014  . Osteoporosis 12/11/2014  .  social 12/29/2014  . Vitamin D deficiency 12/29/2014    She has past surgical history that includes Cesarean section and Tonsillectomy.   Her family history includes Alcohol abuse in her maternal grandfather; Cancer in her paternal grandmother; Diabetes in her father, mother, and paternal grandmother; Heart disease in her maternal grandmother and paternal grandfather; Hyperlipidemia in her father and mother; Hypertension in her father and mother; Mental illness (age of onset: 55) in her sister; Osteoporosis in her maternal grandmother and mother; Stroke in her father and mother.She reports that she has never smoked. She has never used smokeless tobacco. She reports that she drinks  alcohol. She reports that she does not use illicit drugs.  Current Outpatient Prescriptions on File Prior to Visit  Medication Sig Dispense Refill  . calcium carbonate (OS-CAL) 600 MG TABS tablet Take 600 mg by mouth daily.    . Cholecalciferol (VITAMIN D) 2000 UNITS CAPS Take 3,000 Units by mouth daily.     . ergocalciferol (VITAMIN D2) 50000 UNITS capsule Take 1 capsule (50,000 Units total) by mouth once a week. 4 capsule 3  . KRILL OIL PO Take by mouth daily.     No current facility-administered medications on file prior to visit.     Objective:  Objective Physical Exam  Skin:      BP 108/72 mmHg  Pulse 82  Temp(Src) 97.8 F (36.6 C) (Oral)  Wt 110 lb (49.896 kg)  SpO2 98% Wt Readings from Last 3 Encounters:  03/29/15 110 lb (49.896 kg)  12/16/14 110 lb 4 oz (50.009 kg)  11/30/14 111 lb 8 oz (50.576 kg)     Lab Results  Component Value Date   WBC 5.6 11/30/2014   HGB 13.8 11/30/2014   HCT 41.5 11/30/2014   PLT 276.0 11/30/2014   GLUCOSE 88 11/30/2014   CHOL 206* 11/30/2014   TRIG 61.0 11/30/2014   HDL 83.40 11/30/2014   LDLCALC 110* 11/30/2014   ALT 17 11/30/2014   AST 18 11/30/2014   NA 138 11/30/2014   K 3.7 11/30/2014   CL 104 11/30/2014   CREATININE 0.55 11/30/2014   BUN 9 11/30/2014   CO2 29 11/30/2014   TSH 1.93 11/30/2014    Dg Bone Density  12/10/2014   EXAM: DXA BONE DENSITY STUDY  The Bone Mineral Densitometry hard-copy report (which includes all data, graphical  display, and FRAX results when applicable) has been sent directly to the ordering physician.  This report can also be obtained electronically by viewing images for this exam through the performing facility's EMR, or by logging directly into [employer].   Electronically Signed   By: Earle Gell M.D.   On: 12/10/2014 15:28     Assessment & Plan:  Plan I am having Ms. Schreiner start on valACYclovir and HYDROcodone-acetaminophen. I am also having her maintain her calcium carbonate, Vitamin  D, KRILL OIL PO, and ergocalciferol.  Meds ordered this encounter  Medications  . valACYclovir (VALTREX) 1000 MG tablet    Sig: Take 1 tablet (1,000 mg total) by mouth 3 (three) times daily.    Dispense:  30 tablet    Refill:  0  . HYDROcodone-acetaminophen (NORCO/VICODIN) 5-325 MG tablet    Sig: Take 1 tablet by mouth every 6 (six) hours as needed for moderate pain.    Dispense:  30 tablet    Refill:  0    Problem List Items Addressed This Visit    None    Visit Diagnoses    Shingles    -  Primary    Relevant Medications    valACYclovir (VALTREX) 1000 MG tablet    HYDROcodone-acetaminophen (NORCO/VICODIN) 5-325 MG tablet       Follow-up: Return if symptoms worsen or fail to improve.  Garnet Koyanagi, DO

## 2015-03-29 NOTE — Patient Instructions (Signed)

## 2015-03-29 NOTE — Progress Notes (Signed)
Pre visit review using our clinic review tool, if applicable. No additional management support is needed unless otherwise documented below in the visit note. 

## 2015-03-30 ENCOUNTER — Encounter: Payer: Self-pay | Admitting: Family Medicine

## 2015-04-08 ENCOUNTER — Other Ambulatory Visit: Payer: Self-pay | Admitting: Family Medicine

## 2015-04-08 NOTE — Telephone Encounter (Signed)
So I refilled her Vitamin D but she does not have an appt with me til June. I do feel we need to have her repeat a Vitamin D level soon for vitamin D deficiency. Does not need to fast but I want to check and make sure her numbers are improving

## 2015-04-26 ENCOUNTER — Telehealth: Payer: Self-pay | Admitting: Family Medicine

## 2015-04-26 DIAGNOSIS — E559 Vitamin D deficiency, unspecified: Secondary | ICD-10-CM

## 2015-04-26 NOTE — Telephone Encounter (Signed)
Patient informed ok for vitamin D and cmp.  Scheduled lab appt. For 07/05/14 at 1:45 PM

## 2015-04-26 NOTE — Telephone Encounter (Signed)
Caller name:Lynnett Chandonnet Relationship to patient:[phone] Can be reached: Pharmacy:  Reason for call:requesting lab work for Vit D

## 2015-04-26 NOTE — Telephone Encounter (Signed)
OK to repeat Vit D level, with CMP preferably to check calcium for low vitamin D

## 2015-05-06 ENCOUNTER — Other Ambulatory Visit (INDEPENDENT_AMBULATORY_CARE_PROVIDER_SITE_OTHER): Payer: BLUE CROSS/BLUE SHIELD

## 2015-05-06 ENCOUNTER — Ambulatory Visit (INDEPENDENT_AMBULATORY_CARE_PROVIDER_SITE_OTHER): Payer: BLUE CROSS/BLUE SHIELD

## 2015-05-06 DIAGNOSIS — Z23 Encounter for immunization: Secondary | ICD-10-CM

## 2015-05-06 DIAGNOSIS — E559 Vitamin D deficiency, unspecified: Secondary | ICD-10-CM | POA: Diagnosis not present

## 2015-05-06 LAB — COMPREHENSIVE METABOLIC PANEL
ALT: 14 U/L (ref 0–35)
AST: 17 U/L (ref 0–37)
Albumin: 4.4 g/dL (ref 3.5–5.2)
Alkaline Phosphatase: 61 U/L (ref 39–117)
BUN: 8 mg/dL (ref 6–23)
CO2: 29 mEq/L (ref 19–32)
Calcium: 9.7 mg/dL (ref 8.4–10.5)
Chloride: 105 mEq/L (ref 96–112)
Creatinine, Ser: 0.61 mg/dL (ref 0.40–1.20)
GFR: 107.45 mL/min (ref >60.00)
Glucose, Bld: 134 mg/dL — ABNORMAL HIGH (ref 70–99)
Potassium: 3.9 mEq/L (ref 3.5–5.1)
Sodium: 141 mEq/L (ref 135–145)
Total Bilirubin: 0.4 mg/dL (ref 0.2–1.2)
Total Protein: 6.7 g/dL (ref 6.0–8.3)

## 2015-05-06 LAB — VITAMIN D 25 HYDROXY (VIT D DEFICIENCY, FRACTURES): VITD: 64.5 ng/mL (ref 30.00–100.00)

## 2015-05-09 ENCOUNTER — Other Ambulatory Visit: Payer: Self-pay | Admitting: Family Medicine

## 2015-05-09 MED ORDER — VITAMIN D (ERGOCALCIFEROL) 1.25 MG (50000 UNIT) PO CAPS: 50000.0000 [IU] | ORAL_CAPSULE | ORAL | Status: DC

## 2015-07-29 ENCOUNTER — Other Ambulatory Visit: Payer: Self-pay | Admitting: Family Medicine

## 2015-07-29 DIAGNOSIS — Z1231 Encounter for screening mammogram for malignant neoplasm of breast: Secondary | ICD-10-CM

## 2015-08-12 ENCOUNTER — Ambulatory Visit (HOSPITAL_BASED_OUTPATIENT_CLINIC_OR_DEPARTMENT_OTHER)
Admission: RE | Admit: 2015-08-12 | Discharge: 2015-08-12 | Disposition: A | Discharge status: Home / Self Care | Payer: BLUE CROSS/BLUE SHIELD | Source: Ambulatory Visit | Attending: Family Medicine | Admitting: Family Medicine

## 2015-08-12 DIAGNOSIS — Z1231 Encounter for screening mammogram for malignant neoplasm of breast: Secondary | ICD-10-CM | POA: Insufficient documentation

## 2015-12-05 ENCOUNTER — Ambulatory Visit (INDEPENDENT_AMBULATORY_CARE_PROVIDER_SITE_OTHER): Payer: BLUE CROSS/BLUE SHIELD | Admitting: Family Medicine

## 2015-12-05 ENCOUNTER — Encounter: Payer: Self-pay | Admitting: Family Medicine

## 2015-12-05 ENCOUNTER — Other Ambulatory Visit (HOSPITAL_COMMUNITY)
Admission: RE | Admit: 2015-12-05 | Discharge: 2015-12-05 | Disposition: A | Discharge status: Home / Self Care | Payer: BLUE CROSS/BLUE SHIELD | Source: Ambulatory Visit | Attending: Family Medicine | Admitting: Family Medicine

## 2015-12-05 VITALS — BP 120/76 | HR 85 | Temp 98.0°F | Ht 63.0 in | Wt 109.1 lb

## 2015-12-05 DIAGNOSIS — E785 Hyperlipidemia, unspecified: Secondary | ICD-10-CM

## 2015-12-05 DIAGNOSIS — Z0000 Encounter for general adult medical examination without abnormal findings: Secondary | ICD-10-CM

## 2015-12-05 DIAGNOSIS — Z23 Encounter for immunization: Secondary | ICD-10-CM

## 2015-12-05 DIAGNOSIS — E559 Vitamin D deficiency, unspecified: Secondary | ICD-10-CM

## 2015-12-05 DIAGNOSIS — Z124 Encounter for screening for malignant neoplasm of cervix: Secondary | ICD-10-CM | POA: Diagnosis not present

## 2015-12-05 DIAGNOSIS — M810 Age-related osteoporosis without current pathological fracture: Secondary | ICD-10-CM | POA: Diagnosis not present

## 2015-12-05 DIAGNOSIS — Z01419 Encounter for gynecological examination (general) (routine) without abnormal findings: Secondary | ICD-10-CM | POA: Insufficient documentation

## 2015-12-05 DIAGNOSIS — Z1211 Encounter for screening for malignant neoplasm of colon: Secondary | ICD-10-CM

## 2015-12-05 HISTORY — DX: Encounter for screening for malignant neoplasm of cervix: Z12.4

## 2015-12-05 HISTORY — DX: Encounter for general adult medical examination without abnormal findings: Z00.00

## 2015-12-05 LAB — COMPREHENSIVE METABOLIC PANEL
ALT: 15 U/L (ref 0–35)
AST: 19 U/L (ref 0–37)
Albumin: 4.7 g/dL (ref 3.5–5.2)
Alkaline Phosphatase: 56 U/L (ref 39–117)
BUN: 10 mg/dL (ref 6–23)
CO2: 28 mEq/L (ref 19–32)
Calcium: 9.2 mg/dL (ref 8.4–10.5)
Chloride: 105 mEq/L (ref 96–112)
Creatinine, Ser: 0.58 mg/dL (ref 0.40–1.20)
GFR: 113.65 mL/min (ref >60.00)
Glucose, Bld: 88 mg/dL (ref 70–99)
Potassium: 4 mEq/L (ref 3.5–5.1)
Sodium: 140 mEq/L (ref 135–145)
Total Bilirubin: 0.5 mg/dL (ref 0.2–1.2)
Total Protein: 7.2 g/dL (ref 6.0–8.3)

## 2015-12-05 LAB — TSH: TSH: 2.13 u[IU]/mL (ref 0.35–4.50)

## 2015-12-05 LAB — CBC
HCT: 41.5 % (ref 36.0–46.0)
Hemoglobin: 13.8 g/dL (ref 12.0–15.0)
MCHC: 33.2 g/dL (ref 30.0–36.0)
MCV: 91.9 fl (ref 78.0–100.0)
Platelets: 278 10*3/uL (ref 150.0–400.0)
RBC: 4.52 Mil/uL (ref 3.87–5.11)
RDW: 13.1 % (ref 11.5–15.5)
WBC: 5.8 10*3/uL (ref 4.0–10.5)

## 2015-12-05 LAB — VITAMIN D 25 HYDROXY (VIT D DEFICIENCY, FRACTURES): VITD: 43.45 ng/mL (ref 30.00–100.00)

## 2015-12-05 LAB — LIPID PANEL
Cholesterol: 204 mg/dL — ABNORMAL HIGH (ref 0–200)
HDL: 82.4 mg/dL (ref >39.00)
LDL Cholesterol: 111 mg/dL — ABNORMAL HIGH (ref 0–99)
NonHDL: 121.39
Total CHOL/HDL Ratio: 2
Triglycerides: 53 mg/dL (ref 0.0–149.0)
VLDL: 10.6 mg/dL (ref 0.0–40.0)

## 2015-12-05 NOTE — Progress Notes (Signed)
Pre visit review using our clinic review tool, if applicable. No additional management support is needed unless otherwise documented below in the visit note. 

## 2015-12-05 NOTE — Assessment & Plan Note (Signed)
Pap today, no concerns on exam.  

## 2015-12-05 NOTE — Assessment & Plan Note (Signed)
Patient encouraged to maintain heart healthy diet, regular exercise, adequate sleep. Consider daily probiotics. Take medications as prescribed. Given and reviewed copy of ACP documents from [place] Secretary of State and encouraged to complete and return. Labs ordered today 

## 2015-12-05 NOTE — Patient Instructions (Signed)
Call insurance to see if they cover the Zostavax/Shingles shot  Preventive Care for Adults, Female A healthy lifestyle and preventive care can promote health and wellness. Preventive health guidelines for women include the following key practices.  A routine yearly physical is a good way to check with your health care provider about your health and preventive screening. It is a chance to share any concerns and updates on your health and to receive a thorough exam.  Visit your dentist for a routine exam and preventive care every 6 months. Brush your teeth twice a day and floss once a day. Good oral hygiene prevents tooth decay and gum disease.  The frequency of eye exams is based on your age, health, family medical history, use of contact lenses, and other factors. Follow your health care provider's recommendations for frequency of eye exams.  Eat a healthy diet. Foods like vegetables, fruits, whole grains, low-fat dairy products, and lean protein foods contain the nutrients you need without too many calories. Decrease your intake of foods high in solid fats, added sugars, and salt. Eat the right amount of calories for you.Get information about a proper diet from your health care provider, if necessary.  Regular physical exercise is one of the most important things you can do for your health. Most adults should get at least 150 minutes of moderate-intensity exercise (any activity that increases your heart rate and causes you to sweat) each week. In addition, most adults need muscle-strengthening exercises on 2 or more days a week.  Maintain a healthy weight. The body mass index (BMI) is a screening tool to identify possible weight problems. It provides an estimate of body fat based on height and weight. Your health care provider can find your BMI and can help you achieve or maintain a healthy weight.For adults 20 years and older:  A BMI below 18.5 is considered underweight.  A BMI of 18.5 to 24.9  is normal.  A BMI of 25 to 29.9 is considered overweight.  A BMI of 30 and above is considered obese.  Maintain normal blood lipids and cholesterol levels by exercising and minimizing your intake of saturated fat. Eat a balanced diet with plenty of fruit and vegetables. Blood tests for lipids and cholesterol should begin at age 78 and be repeated every 5 years. If your lipid or cholesterol levels are high, you are over 50, or you are at high risk for heart disease, you may need your cholesterol levels checked more frequently.Ongoing high lipid and cholesterol levels should be treated with medicines if diet and exercise are not working.  If you smoke, find out from your health care provider how to quit. If you do not use tobacco, do not start.  Lung cancer screening is recommended for adults aged 5-80 years who are at high risk for developing lung cancer because of a history of smoking. A yearly low-dose CT scan of the lungs is recommended for people who have at least a 30-pack-year history of smoking and are a current smoker or have quit within the past 15 years. A pack year of smoking is smoking an average of 1 pack of cigarettes a day for 1 year (for example: 1 pack a day for 30 years or 2 packs a day for 15 years). Yearly screening should continue until the smoker has stopped smoking for at least 15 years. Yearly screening should be stopped for people who develop a health problem that would prevent them from having lung cancer treatment.  If you are pregnant, do not drink alcohol. If you are breastfeeding, be very cautious about drinking alcohol. If you are not pregnant and choose to drink alcohol, do not have more than 1 drink per day. One drink is considered to be 12 ounces (355 mL) of beer, 5 ounces (148 mL) of wine, or 1.5 ounces (44 mL) of liquor.  Avoid use of street drugs. Do not share needles with anyone. Ask for help if you need support or instructions about stopping the use of  drugs.  High blood pressure causes heart disease and increases the risk of stroke. Your blood pressure should be checked at least every 1 to 2 years. Ongoing high blood pressure should be treated with medicines if weight loss and exercise do not work.  If you are 66-61 years old, ask your health care provider if you should take aspirin to prevent strokes.  Diabetes screening is done by taking a blood sample to check your blood glucose level after you have not eaten for a certain period of time (fasting). If you are not overweight and you do not have risk factors for diabetes, you should be screened once every 3 years starting at age 32. If you are overweight or obese and you are 35-60 years of age, you should be screened for diabetes every year as part of your cardiovascular risk assessment.  Breast cancer screening is essential preventive care for women. You should practice "breast self-awareness." This means understanding the normal appearance and feel of your breasts and may include breast self-examination. Any changes detected, no matter how small, should be reported to a health care provider. Women in their 63s and 30s should have a clinical breast exam (CBE) by a health care provider as part of a regular health exam every 1 to 3 years. After age 33, women should have a CBE every year. Starting at age 80, women should consider having a mammogram (breast X-ray test) every year. Women who have a family history of breast cancer should talk to their health care provider about genetic screening. Women at a high risk of breast cancer should talk to their health care providers about having an MRI and a mammogram every year.  Breast cancer gene (BRCA)-related cancer risk assessment is recommended for women who have family members with BRCA-related cancers. BRCA-related cancers include breast, ovarian, tubal, and peritoneal cancers. Having family members with these cancers may be associated with an increased  risk for harmful changes (mutations) in the breast cancer genes BRCA1 and BRCA2. Results of the assessment will determine the need for genetic counseling and BRCA1 and BRCA2 testing.  Your health care provider may recommend that you be screened regularly for cancer of the pelvic organs (ovaries, uterus, and vagina). This screening involves a pelvic examination, including checking for microscopic changes to the surface of your cervix (Pap test). You may be encouraged to have this screening done every 3 years, beginning at age 74.  For women ages 87-65, health care providers may recommend pelvic exams and Pap testing every 3 years, or they may recommend the Pap and pelvic exam, combined with testing for human papilloma virus (HPV), every 5 years. Some types of HPV increase your risk of cervical cancer. Testing for HPV may also be done on women of any age with unclear Pap test results.  Other health care providers may not recommend any screening for nonpregnant women who are considered low risk for pelvic cancer and who do not have symptoms. Ask your  health care provider if a screening pelvic exam is right for you.  If you have had past treatment for cervical cancer or a condition that could lead to cancer, you need Pap tests and screening for cancer for at least 20 years after your treatment. If Pap tests have been discontinued, your risk factors (such as having a new sexual partner) need to be reassessed to determine if screening should resume. Some women have medical problems that increase the chance of getting cervical cancer. In these cases, your health care provider may recommend more frequent screening and Pap tests.  Colorectal cancer can be detected and often prevented. Most routine colorectal cancer screening begins at the age of 83 years and continues through age 27 years. However, your health care provider may recommend screening at an earlier age if you have risk factors for colon cancer. On a  yearly basis, your health care provider may provide home test kits to check for hidden blood in the stool. Use of a small camera at the end of a tube, to directly examine the colon (sigmoidoscopy or colonoscopy), can detect the earliest forms of colorectal cancer. Talk to your health care provider about this at age 33, when routine screening begins. Direct exam of the colon should be repeated every 5-10 years through age 91 years, unless early forms of precancerous polyps or small growths are found.  People who are at an increased risk for hepatitis B should be screened for this virus. You are considered at high risk for hepatitis B if:  You were born in a country where hepatitis B occurs often. Talk with your health care provider about which countries are considered high risk.  Your parents were born in a high-risk country and you have not received a shot to protect against hepatitis B (hepatitis B vaccine).  You have HIV or AIDS.  You use needles to inject street drugs.  You live with, or have sex with, someone who has hepatitis B.  You get hemodialysis treatment.  You take certain medicines for conditions like cancer, organ transplantation, and autoimmune conditions.  Hepatitis C blood testing is recommended for all people born from [birth year] through 1965 and any individual with known risks for hepatitis C.  Practice safe sex. Use condoms and avoid high-risk sexual practices to reduce the spread of sexually transmitted infections (STIs). STIs include gonorrhea, chlamydia, syphilis, trichomonas, herpes, HPV, and human immunodeficiency virus (HIV). Herpes, HIV, and HPV are viral illnesses that have no cure. They can result in disability, cancer, and death.  You should be screened for sexually transmitted illnesses (STIs) including gonorrhea and chlamydia if:  You are sexually active and are younger than 24 years.  You are older than 24 years and your health care provider tells you that you are  at risk for this type of infection.  Your sexual activity has changed since you were last screened and you are at an increased risk for chlamydia or gonorrhea. Ask your health care provider if you are at risk.  If you are at risk of being infected with HIV, it is recommended that you take a prescription medicine daily to prevent HIV infection. This is called preexposure prophylaxis (PrEP). You are considered at risk if:  You are sexually active and do not regularly use condoms or know the HIV status of your partner(s).  You take drugs by injection.  You are sexually active with a partner who has HIV.  Talk with your health care provider about whether  you are at high risk of being infected with HIV. If you choose to begin PrEP, you should first be tested for HIV. You should then be tested every 3 months for as long as you are taking PrEP.  Osteoporosis is a disease in which the bones lose minerals and strength with aging. This can result in serious bone fractures or breaks. The risk of osteoporosis can be identified using a bone density scan. Women ages 65 years and over and women at risk for fractures or osteoporosis should discuss screening with their health care providers. Ask your health care provider whether you should take a calcium supplement or vitamin D to reduce the rate of osteoporosis.  Menopause can be associated with physical symptoms and risks. Hormone replacement therapy is available to decrease symptoms and risks. You should talk to your health care provider about whether hormone replacement therapy is right for you.  Use sunscreen. Apply sunscreen liberally and repeatedly throughout the day. You should seek shade when your shadow is shorter than you. Protect yourself by wearing long sleeves, pants, a wide-brimmed hat, and sunglasses year round, whenever you are outdoors.  Once a month, do a whole body skin exam, using a mirror to look at the skin on your back. Tell your health  care provider of new moles, moles that have irregular borders, moles that are larger than a pencil eraser, or moles that have changed in shape or color.  Stay current with required vaccines (immunizations).  Influenza vaccine. All adults should be immunized every year.  Tetanus, diphtheria, and acellular pertussis (Td, Tdap) vaccine. Pregnant women should receive 1 dose of Tdap vaccine during each pregnancy. The dose should be obtained regardless of the length of time since the last dose. Immunization is preferred during the 27th-36th week of gestation. An adult who has not previously received Tdap or who does not know her vaccine status should receive 1 dose of Tdap. This initial dose should be followed by tetanus and diphtheria toxoids (Td) booster doses every 10 years. Adults with an unknown or incomplete history of completing a 3-dose immunization series with Td-containing vaccines should begin or complete a primary immunization series including a Tdap dose. Adults should receive a Td booster every 10 years.  Varicella vaccine. An adult without evidence of immunity to varicella should receive 2 doses or a second dose if she has previously received 1 dose. Pregnant females who do not have evidence of immunity should receive the first dose after pregnancy. This first dose should be obtained before leaving the health care facility. The second dose should be obtained 4-8 weeks after the first dose.  Human papillomavirus (HPV) vaccine. Females aged 13-26 years who have not received the vaccine previously should obtain the 3-dose series. The vaccine is not recommended for use in pregnant females. However, pregnancy testing is not needed before receiving a dose. If a female is found to be pregnant after receiving a dose, no treatment is needed. In that case, the remaining doses should be delayed until after the pregnancy. Immunization is recommended for any person with an immunocompromised condition through  the age of 26 years if she did not get any or all doses earlier. During the 3-dose series, the second dose should be obtained 4-8 weeks after the first dose. The third dose should be obtained 24 weeks after the first dose and 16 weeks after the second dose.  Zoster vaccine. One dose is recommended for adults aged 60 years or older unless certain conditions   are present.  Measles, mumps, and rubella (MMR) vaccine. Adults born before [birth year] generally are considered immune to measles and mumps. Adults born in [birth year] or later should have 1 or more doses of MMR vaccine unless there is a contraindication to the vaccine or there is laboratory evidence of immunity to each of the three diseases. A routine second dose of MMR vaccine should be obtained at least 28 days after the first dose for students attending postsecondary schools, health care workers, or international travelers. People who received inactivated measles vaccine or an unknown type of measles vaccine during 1963-1967 should receive 2 doses of MMR vaccine. People who received inactivated mumps vaccine or an unknown type of mumps vaccine before 1979 and are at high risk for mumps infection should consider immunization with 2 doses of MMR vaccine. For females of childbearing age, rubella immunity should be determined. If there is no evidence of immunity, females who are not pregnant should be vaccinated. If there is no evidence of immunity, females who are pregnant should delay immunization until after pregnancy. Unvaccinated health care workers born before [birth year] who lack laboratory evidence of measles, mumps, or rubella immunity or laboratory confirmation of disease should consider measles and mumps immunization with 2 doses of MMR vaccine or rubella immunization with 1 dose of MMR vaccine.  Pneumococcal 13-valent conjugate (PCV13) vaccine. When indicated, a person who is uncertain of his immunization history and has no record of immunization should receive the  PCV13 vaccine. All adults 46 years of age and older should receive this vaccine. An adult aged 26 years or older who has certain medical conditions and has not been previously immunized should receive 1 dose of PCV13 vaccine. This PCV13 should be followed with a dose of pneumococcal polysaccharide (PPSV23) vaccine. Adults who are at high risk for pneumococcal disease should obtain the PPSV23 vaccine at least 8 weeks after the dose of PCV13 vaccine. Adults older than 58 years of age who have normal immune system function should obtain the PPSV23 vaccine dose at least 1 year after the dose of PCV13 vaccine.  Pneumococcal polysaccharide (PPSV23) vaccine. When PCV13 is also indicated, PCV13 should be obtained first. All adults aged 39 years and older should be immunized. An adult younger than age 63 years who has certain medical conditions should be immunized. Any person who resides in a nursing home or long-term care facility should be immunized. An adult smoker should be immunized. People with an immunocompromised condition and certain other conditions should receive both PCV13 and PPSV23 vaccines. People with human immunodeficiency virus (HIV) infection should be immunized as soon as possible after diagnosis. Immunization during chemotherapy or radiation therapy should be avoided. Routine use of PPSV23 vaccine is not recommended for American Indians, [place] Natives, or people younger than 65 years unless there are medical conditions that require PPSV23 vaccine. When indicated, people who have unknown immunization and have no record of immunization should receive PPSV23 vaccine. One-time revaccination 5 years after the first dose of PPSV23 is recommended for people aged 19-64 years who have chronic kidney failure, nephrotic syndrome, asplenia, or immunocompromised conditions. People who received 1-2 doses of PPSV23 before age 14 years should receive another dose of PPSV23 vaccine at age 30 years or later if at least  5 years have passed since the previous dose. Doses of PPSV23 are not needed for people immunized with PPSV23 at or after age 63 years.  Meningococcal vaccine. Adults with asplenia or persistent complement component deficiencies should receive 2 doses  of quadrivalent meningococcal conjugate (MenACWY-D) vaccine. The doses should be obtained at least 2 months apart. Microbiologists working with certain meningococcal bacteria, military recruits, people at risk during an outbreak, and people who travel to or live in countries with a high rate of meningitis should be immunized. A first-year college student up through age 21 years who is living in a residence hall should receive a dose if she did not receive a dose on or after her 16th birthday. Adults who have certain high-risk conditions should receive one or more doses of vaccine.  Hepatitis A vaccine. Adults who wish to be protected from this disease, have certain high-risk conditions, work with hepatitis A-infected animals, work in hepatitis A research labs, or travel to or work in countries with a high rate of hepatitis A should be immunized. Adults who were previously unvaccinated and who anticipate close contact with an international adoptee during the first 60 days after arrival in the [place] from a country with a high rate of hepatitis A should be immunized.  Hepatitis B vaccine. Adults who wish to be protected from this disease, have certain high-risk conditions, may be exposed to blood or other infectious body fluids, are household contacts or sex partners of hepatitis B positive people, are clients or workers in certain care facilities, or travel to or work in countries with a high rate of hepatitis B should be immunized.  Haemophilus influenzae type b (Hib) vaccine. A previously unvaccinated person with asplenia or sickle cell disease or having a scheduled splenectomy should receive 1 dose of Hib vaccine. Regardless of previous immunization, a  recipient of a hematopoietic stem cell transplant should receive a 3-dose series 6-12 months after her successful transplant. Hib vaccine is not recommended for adults with HIV infection. Preventive Services / Frequency Ages 19 to 39 years  Blood pressure check.** / Every 3-5 years.  Lipid and cholesterol check.** / Every 5 years beginning at age 20.  Clinical breast exam.** / Every 3 years for women in their 20s and 30s.  BRCA-related cancer risk assessment.** / For women who have family members with a BRCA-related cancer (breast, ovarian, tubal, or peritoneal cancers).  Pap test.** / Every 2 years from ages 21 through 29. Every 3 years starting at age 30 through age 65 or 70 with a history of 3 consecutive normal Pap tests.  HPV screening.** / Every 3 years from ages 30 through ages 65 to 70 with a history of 3 consecutive normal Pap tests.  Hepatitis C blood test.** / For any individual with known risks for hepatitis C.  Skin self-exam. / Monthly.  Influenza vaccine. / Every year.  Tetanus, diphtheria, and acellular pertussis (Tdap, Td) vaccine.** / Consult your health care provider. Pregnant women should receive 1 dose of Tdap vaccine during each pregnancy. 1 dose of Td every 10 years.  Varicella vaccine.** / Consult your health care provider. Pregnant females who do not have evidence of immunity should receive the first dose after pregnancy.  HPV vaccine. / 3 doses over 6 months, if 26 and younger. The vaccine is not recommended for use in pregnant females. However, pregnancy testing is not needed before receiving a dose.  Measles, mumps, rubella (MMR) vaccine.** / You need at least 1 dose of MMR if you were born in 1957 or later. You may also need a 2nd dose. For females of childbearing age, rubella immunity should be determined. If there is no evidence of immunity, females who are not pregnant should be   vaccinated. If there is no evidence of immunity, females who are pregnant  should delay immunization until after pregnancy.  Pneumococcal 13-valent conjugate (PCV13) vaccine.** / Consult your health care provider.  Pneumococcal polysaccharide (PPSV23) vaccine.** / 1 to 2 doses if you smoke cigarettes or if you have certain conditions.  Meningococcal vaccine.** / 1 dose if you are age 19 to 21 years and a first-year college student living in a residence hall, or have one of several medical conditions, you need to get vaccinated against meningococcal disease. You may also need additional booster doses.  Hepatitis A vaccine.** / Consult your health care provider.  Hepatitis B vaccine.** / Consult your health care provider.  Haemophilus influenzae type b (Hib) vaccine.** / Consult your health care provider. Ages 40 to 64 years  Blood pressure check.** / Every year.  Lipid and cholesterol check.** / Every 5 years beginning at age 20 years.  Lung cancer screening. / Every year if you are aged 55-80 years and have a 30-pack-year history of smoking and currently smoke or have quit within the past 15 years. Yearly screening is stopped once you have quit smoking for at least 15 years or develop a health problem that would prevent you from having lung cancer treatment.  Clinical breast exam.** / Every year after age 40 years.  BRCA-related cancer risk assessment.** / For women who have family members with a BRCA-related cancer (breast, ovarian, tubal, or peritoneal cancers).  Mammogram.** / Every year beginning at age 40 years and continuing for as long as you are in good health. Consult with your health care provider.  Pap test.** / Every 3 years starting at age 30 years through age 65 or 70 years with a history of 3 consecutive normal Pap tests.  HPV screening.** / Every 3 years from ages 30 years through ages 65 to 70 years with a history of 3 consecutive normal Pap tests.  Fecal occult blood test (FOBT) of stool. / Every year beginning at age 50 years and  continuing until age 75 years. You may not need to do this test if you get a colonoscopy every 10 years.  Flexible sigmoidoscopy or colonoscopy.** / Every 5 years for a flexible sigmoidoscopy or every 10 years for a colonoscopy beginning at age 50 years and continuing until age 75 years.  Hepatitis C blood test.** / For all people born from 1945 through 1965 and any individual with known risks for hepatitis C.  Skin self-exam. / Monthly.  Influenza vaccine. / Every year.  Tetanus, diphtheria, and acellular pertussis (Tdap/Td) vaccine.** / Consult your health care provider. Pregnant women should receive 1 dose of Tdap vaccine during each pregnancy. 1 dose of Td every 10 years.  Varicella vaccine.** / Consult your health care provider. Pregnant females who do not have evidence of immunity should receive the first dose after pregnancy.  Zoster vaccine.** / 1 dose for adults aged 60 years or older.  Measles, mumps, rubella (MMR) vaccine.** / You need at least 1 dose of MMR if you were born in 1957 or later. You may also need a second dose. For females of childbearing age, rubella immunity should be determined. If there is no evidence of immunity, females who are not pregnant should be vaccinated. If there is no evidence of immunity, females who are pregnant should delay immunization until after pregnancy.  Pneumococcal 13-valent conjugate (PCV13) vaccine.** / Consult your health care provider.  Pneumococcal polysaccharide (PPSV23) vaccine.** / 1 to 2 doses if you smoke   cigarettes or if you have certain conditions.  Meningococcal vaccine.** / Consult your health care provider.  Hepatitis A vaccine.** / Consult your health care provider.  Hepatitis B vaccine.** / Consult your health care provider.  Haemophilus influenzae type b (Hib) vaccine.** / Consult your health care provider. Ages 65 years and over  Blood pressure check.** / Every year.  Lipid and cholesterol check.** / Every 5 years  beginning at age 20 years.  Lung cancer screening. / Every year if you are aged 55-80 years and have a 30-pack-year history of smoking and currently smoke or have quit within the past 15 years. Yearly screening is stopped once you have quit smoking for at least 15 years or develop a health problem that would prevent you from having lung cancer treatment.  Clinical breast exam.** / Every year after age 40 years.  BRCA-related cancer risk assessment.** / For women who have family members with a BRCA-related cancer (breast, ovarian, tubal, or peritoneal cancers).  Mammogram.** / Every year beginning at age 40 years and continuing for as long as you are in good health. Consult with your health care provider.  Pap test.** / Every 3 years starting at age 30 years through age 65 or 70 years with 3 consecutive normal Pap tests. Testing can be stopped between 65 and 70 years with 3 consecutive normal Pap tests and no abnormal Pap or HPV tests in the past 10 years.  HPV screening.** / Every 3 years from ages 30 years through ages 65 or 70 years with a history of 3 consecutive normal Pap tests. Testing can be stopped between 65 and 70 years with 3 consecutive normal Pap tests and no abnormal Pap or HPV tests in the past 10 years.  Fecal occult blood test (FOBT) of stool. / Every year beginning at age 50 years and continuing until age 75 years. You may not need to do this test if you get a colonoscopy every 10 years.  Flexible sigmoidoscopy or colonoscopy.** / Every 5 years for a flexible sigmoidoscopy or every 10 years for a colonoscopy beginning at age 50 years and continuing until age 75 years.  Hepatitis C blood test.** / For all people born from 1945 through 1965 and any individual with known risks for hepatitis C.  Osteoporosis screening.** / A one-time screening for women ages 65 years and over and women at risk for fractures or osteoporosis.  Skin self-exam. / Monthly.  Influenza vaccine. /  Every year.  Tetanus, diphtheria, and acellular pertussis (Tdap/Td) vaccine.** / 1 dose of Td every 10 years.  Varicella vaccine.** / Consult your health care provider.  Zoster vaccine.** / 1 dose for adults aged 60 years or older.  Pneumococcal 13-valent conjugate (PCV13) vaccine.** / Consult your health care provider.  Pneumococcal polysaccharide (PPSV23) vaccine.** / 1 dose for all adults aged 65 years and older.  Meningococcal vaccine.** / Consult your health care provider.  Hepatitis A vaccine.** / Consult your health care provider.  Hepatitis B vaccine.** / Consult your health care provider.  Haemophilus influenzae type b (Hib) vaccine.** / Consult your health care provider. ** Family history and personal history of risk and conditions may change your health care provider's recommendations.   This information is not intended to replace advice given to you by your health care provider. Make sure you discuss any questions you have with your health care provider.   Document Released: 08/07/2001 Document Revised: 07/02/2014 Document Reviewed: 11/06/2010 Elsevier Interactive Patient Education 2016 Elsevier Inc.  

## 2015-12-05 NOTE — Assessment & Plan Note (Signed)
Takes Vitamin D 2000 IU daily, check level today

## 2015-12-05 NOTE — Progress Notes (Signed)
Patient ID: Catherine Haynes, female   DOB: [birth year], 60 y.o.   MRN: [id number]   Subjective:    Patient ID: Catherine Haynes, female    DOB: Jan 04, 1956, 60 y.o.   MRN: [id number]  Chief Complaint  Patient presents with  . Annual Exam    HPI Patient is in today for follow up and annual exam. She is feeling well. No recent concerns. Is trying to stay acive and maintain a heart healthy diet. Denies CP/palp/SOB/HA/congestion/fevers/GI or GU c/o. Taking meds as prescribed  Past Medical History  Diagnosis Date  . Mitral valve prolapse   . Hyperlipidemia   . Allergy   . Morbid obesity ([place]) 12/11/2014  . 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 Atrial fibrillation ([place]) 12/11/2014  . Paroxysmal atrial fibrillation ([place]) 12/11/2014  . BCC (basal cell carcinoma), face 12/11/2014  . Hx: UTI (urinary tract infection) 12/11/2014  . Osteoporosis 12/11/2014  .  social 12/29/2014  . Vitamin D deficiency 12/29/2014  . Cervical cancer screening 12/05/2015  . Preventative health care 12/05/2015    Past Surgical History  Procedure Laterality Date  . Cesarean section    . Tonsillectomy      Family History  Problem Relation Age of Onset  . Stroke Mother     TIA  . Hypertension Mother   . Hyperlipidemia Mother   . Diabetes Mother     diet controlled  . Osteoporosis Mother   . Stroke Father   . Hypertension Father   . Diabetes Father   . Hyperlipidemia Father   . Mental illness Sister 73    suicide  . Heart disease Maternal Grandmother     chf  . Osteoporosis Maternal Grandmother   . Alcohol abuse Maternal Grandfather   . Cancer Paternal Grandmother   . Diabetes Paternal Grandmother   . Heart disease Paternal Grandfather     Social History   Social History  . Marital Status: Married    Spouse Name: N/A  . Number of Children: N/A  . Years of Education: N/A   Occupational History  . clean houses     Social History Main Topics  . Smoking status: Never Smoker   . Smokeless tobacco:  Never Used  . Alcohol Use: 0.0 oz/week    0 Standard drinks or equivalent per week     Comment: social.  . Drug Use: No  . 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 Not on file   Social History Narrative    Outpatient Prescriptions Prior to Visit  Medication Sig Dispense Refill  . Cholecalciferol (VITAMIN D) 2000 UNITS CAPS Take 3,000 Units by mouth daily.     Marland Kitchen KRILL OIL PO Take by mouth daily.    . calcium carbonate (OS-CAL) 600 MG TABS tablet Take 1,200 mg by mouth daily.     Marland Kitchen HYDROcodone-acetaminophen (NORCO/VICODIN) 5-325 MG tablet Take 1 tablet by mouth every 6 (six) hours as needed for moderate pain. 30 tablet 0  . valACYclovir (VALTREX) 1000 MG tablet Take 1 tablet (1,000 mg total) by mouth 3 (three) times daily. 30 tablet 0   No facility-administered medications prior to visit.    No Known Allergies  Review of Systems  Constitutional: Negative for fever, chills and malaise/fatigue.  HENT: Negative for congestion and hearing loss.   Eyes: Negative for discharge.  Respiratory: Negative for cough, sputum production and shortness of breath.  Cardiovascular: Negative for chest pain, palpitations and leg swelling.  Gastrointestinal: Negative for heartburn, nausea, vomiting, abdominal pain, diarrhea, constipation and blood in stool.  Genitourinary: Negative for dysuria, urgency, frequency and hematuria.  Musculoskeletal: Negative for myalgias, back pain and falls.  Skin: Negative for rash.  Neurological: Negative for dizziness, sensory change, loss of consciousness, weakness and headaches.  Endo/Heme/Allergies: Negative for environmental allergies. Does not bruise/bleed easily.  Psychiatric/Behavioral: Negative for depression and suicidal ideas. The patient is not nervous/anxious and does not have insomnia.        Objective:    Physical Exam  Constitutional: She is  oriented to person, place, and time. She appears well-developed and well-nourished. No distress.  HENT:  Head: Normocephalic and atraumatic.  Right Ear: External ear normal.  Left Ear: External ear normal.  Nose: Nose normal.  Mouth/Throat: Oropharynx is clear and moist.  Eyes: Conjunctivae and EOM are normal. Pupils are equal, round, and reactive to light. Right eye exhibits no discharge. Left eye exhibits no discharge.  Neck: Normal range of motion. Neck supple. No JVD present. No thyromegaly present.  Cardiovascular: Normal rate, regular rhythm, normal heart sounds and intact distal pulses.   No murmur heard. Pulmonary/Chest: Effort normal and breath sounds normal. No respiratory distress. She has no wheezes. She has no rales. She exhibits no tenderness.  Abdominal: Soft. Bowel sounds are normal. She exhibits no distension and no mass. There is no tenderness. There is no rebound and no guarding.  Genitourinary: Vagina normal and uterus normal. Guaiac negative stool. No vaginal discharge found.  Musculoskeletal: Normal range of motion. She exhibits no edema or tenderness.  Lymphadenopathy:    She has no cervical adenopathy.  Neurological: She is alert and oriented to person, place, and time. She has normal reflexes. No cranial nerve deficit.  Skin: Skin is warm and dry. No rash noted. She is not diaphoretic. No erythema.  Psychiatric: She has a normal mood and affect. Her behavior is normal. Judgment and thought content normal.  Nursing note and vitals reviewed.   BP 120/76 mmHg  Pulse 85  Temp(Src) 98 F (36.7 C) (Oral)  Ht 5\' 3"  (1.6 m)  Wt 109 lb 2 oz (49.499 kg)  BMI 19.34 kg/m2  SpO2 98% Wt Readings from Last 3 Encounters:  12/05/15 109 lb 2 oz (49.499 kg)  03/29/15 110 lb (49.896 kg)  12/16/14 110 lb 4 oz (50.009 kg)     Lab Results  Component Value Date   WBC 5.6 11/30/2014   HGB 13.8 11/30/2014   HCT 41.5 11/30/2014   PLT 276.0 11/30/2014   GLUCOSE 134*  05/06/2015   CHOL 206* 11/30/2014   TRIG 61.0 11/30/2014   HDL 83.40 11/30/2014   LDLCALC 110* 11/30/2014   ALT 14 05/06/2015   AST 17 05/06/2015   NA 141 05/06/2015   K 3.9 05/06/2015   CL 105 05/06/2015   CREATININE 0.61 05/06/2015   BUN 8 05/06/2015   CO2 29 05/06/2015   TSH 1.93 11/30/2014    Lab Results  Component Value Date   TSH 1.93 11/30/2014   Lab Results  Component Value Date   WBC 5.6 11/30/2014   HGB 13.8 11/30/2014   HCT 41.5 11/30/2014   MCV 91.1 11/30/2014   PLT 276.0 11/30/2014   Lab Results  Component Value Date   NA 141 05/06/2015   K 3.9 05/06/2015   CO2 29 05/06/2015   GLUCOSE 134* 05/06/2015   BUN 8 05/06/2015   CREATININE 0.61 05/06/2015  BILITOT 0.4 05/06/2015   ALKPHOS 61 05/06/2015   AST 17 05/06/2015   ALT 14 05/06/2015   PROT 6.7 05/06/2015   ALBUMIN 4.4 05/06/2015   CALCIUM 9.7 05/06/2015   GFR 107.45 05/06/2015   Lab Results  Component Value Date   CHOL 206* 11/30/2014   Lab Results  Component Value Date   HDL 83.40 11/30/2014   Lab Results  Component Value Date   LDLCALC 110* 11/30/2014   Lab Results  Component Value Date   TRIG 61.0 11/30/2014   Lab Results  Component Value Date   CHOLHDL 2 11/30/2014   No results found for: HGBA1C     Assessment & Plan:   Problem List Items Addressed This Visit    Vitamin D deficiency    Takes Vitamin D 2000 IU daily, check level today      Relevant Orders   VITAMIN D 25 Hydroxy (Vit-D Deficiency, Fractures)   TSH   CBC   Lipid panel   Comprehensive metabolic panel   Cytology - PAP   Preventative health care    Patient encouraged to maintain heart healthy diet, regular exercise, adequate sleep. Consider daily probiotics. Take medications as prescribed. Given and reviewed copy of ACP documents from [employer] and encouraged to complete and return. Labs ordered today      Relevant Orders   VITAMIN D 25 Hydroxy (Vit-D Deficiency, Fractures)   TSH   CBC     Lipid panel   Comprehensive metabolic panel   Cytology - PAP   Osteoporosis   Relevant Medications   calcium citrate (CALCITRATE - DOSED IN MG ELEMENTAL CALCIUM) 950 MG tablet   Other Relevant Orders   VITAMIN D 25 Hydroxy (Vit-D Deficiency, Fractures)   TSH   CBC   Lipid panel   Comprehensive metabolic panel   Cytology - PAP   Hyperlipidemia - Primary   Relevant Orders   VITAMIN D 25 Hydroxy (Vit-D Deficiency, Fractures)   TSH   CBC   Lipid panel   Comprehensive metabolic panel   Cytology - PAP   Cervical cancer screening    Pap today, no concerns on exam.       Relevant Orders   VITAMIN D 25 Hydroxy (Vit-D Deficiency, Fractures)   TSH   CBC   Lipid panel   Comprehensive metabolic panel   Cytology - PAP      I have discontinued Ms. Bute calcium carbonate, valACYclovir, and HYDROcodone-acetaminophen. I am also having her maintain her Vitamin D, KRILL OIL PO, and calcium citrate.  Meds ordered this encounter  Medications  . calcium citrate (CALCITRATE - DOSED IN MG ELEMENTAL CALCIUM) 950 MG tablet    Sig: Take 200 mg of elemental calcium by mouth daily.     Penni Homans, MD

## 2015-12-06 LAB — CYTOLOGY - PAP

## 2016-01-11 ENCOUNTER — Telehealth: Payer: Self-pay | Admitting: Family Medicine

## 2016-01-11 DIAGNOSIS — N3000 Acute cystitis without hematuria: Secondary | ICD-10-CM | POA: Diagnosis not present

## 2016-01-11 DIAGNOSIS — R300 Dysuria: Secondary | ICD-10-CM | POA: Diagnosis not present

## 2016-01-11 NOTE — Telephone Encounter (Signed)
Called patient and left message to return call

## 2016-01-11 NOTE — Telephone Encounter (Signed)
Please advise. I do see h/o UTI on problem list, but no other documentation and I do not see that we have prescribed atbx for UTI in the past.

## 2016-01-11 NOTE — Telephone Encounter (Signed)
Caller name:Iwata, Stephanieann Relation to [id number] Call back [place]: [id number], [place] [phone] (Phone) [phone] (Fax)         Reason for call:  Patient states she has reoccurring UTI's and PCP advised she would not need an appt in the future, advised patient PCP is out of the office. Patient was adamant about a Rx for the following UTI sytomps pelvic pain, increased urge to urinate, pain with urination and back pain.please advise

## 2016-01-11 NOTE — Telephone Encounter (Signed)
Pt has not returned call. Will forward to PCP for review upon her return to office tomorrow.

## 2016-01-11 NOTE — Telephone Encounter (Signed)
I would recommend OV since she has never been treated for UTI by our office.  We really should get a culture to make sure we are choosing the proper antibiotic.

## 2016-01-11 NOTE — Telephone Encounter (Signed)
If she returns call and is not struggling with severe pain, fevers or systemic concerns, can document symptoms (ie dysuria, urgency, etc and order UA with C&S and then we can treat.

## 2016-02-17 DIAGNOSIS — S239XXA Sprain of unspecified parts of thorax, initial encounter: Secondary | ICD-10-CM | POA: Diagnosis not present

## 2016-04-12 DIAGNOSIS — Z23 Encounter for immunization: Secondary | ICD-10-CM | POA: Diagnosis not present

## 2016-05-10 ENCOUNTER — Encounter: Payer: Self-pay | Admitting: Family Medicine

## 2016-05-31 DIAGNOSIS — J0100 Acute maxillary sinusitis, unspecified: Secondary | ICD-10-CM | POA: Diagnosis not present

## 2016-06-12 DIAGNOSIS — H1045 Other chronic allergic conjunctivitis: Secondary | ICD-10-CM | POA: Diagnosis not present

## 2016-12-07 ENCOUNTER — Encounter: Payer: BLUE CROSS/BLUE SHIELD | Admitting: Family Medicine

## 2017-01-24 ENCOUNTER — Encounter: Payer: Self-pay | Admitting: Family Medicine

## 2017-01-24 ENCOUNTER — Ambulatory Visit (INDEPENDENT_AMBULATORY_CARE_PROVIDER_SITE_OTHER): Payer: BLUE CROSS/BLUE SHIELD | Admitting: Family Medicine

## 2017-01-24 VITALS — BP 116/76 | HR 76 | Temp 98.2°F | Resp 18 | Wt 104.2 lb

## 2017-01-24 DIAGNOSIS — Z87440 Personal history of urinary (tract) infections: Secondary | ICD-10-CM | POA: Diagnosis not present

## 2017-01-24 DIAGNOSIS — E559 Vitamin D deficiency, unspecified: Secondary | ICD-10-CM

## 2017-01-24 DIAGNOSIS — R739 Hyperglycemia, unspecified: Secondary | ICD-10-CM | POA: Diagnosis not present

## 2017-01-24 DIAGNOSIS — M810 Age-related osteoporosis without current pathological fracture: Secondary | ICD-10-CM

## 2017-01-24 DIAGNOSIS — Z0000 Encounter for general adult medical examination without abnormal findings: Secondary | ICD-10-CM

## 2017-01-24 DIAGNOSIS — Z8619 Personal history of other infectious and parasitic diseases: Secondary | ICD-10-CM | POA: Diagnosis not present

## 2017-01-24 DIAGNOSIS — E785 Hyperlipidemia, unspecified: Secondary | ICD-10-CM | POA: Diagnosis not present

## 2017-01-24 DIAGNOSIS — G8929 Other chronic pain: Secondary | ICD-10-CM | POA: Diagnosis not present

## 2017-01-24 DIAGNOSIS — C44310 Basal cell carcinoma of skin of unspecified parts of face: Secondary | ICD-10-CM

## 2017-01-24 DIAGNOSIS — M545 Low back pain: Secondary | ICD-10-CM

## 2017-01-24 DIAGNOSIS — Z7289 Other problems related to lifestyle: Secondary | ICD-10-CM

## 2017-01-24 HISTORY — DX: Personal history of other infectious and parasitic diseases: Z86.19

## 2017-01-24 NOTE — Assessment & Plan Note (Signed)
Has not seen derm for past year but no concerns will get [place] the name of her dermatologist and will have a visit

## 2017-01-24 NOTE — Assessment & Plan Note (Signed)
Patient encouraged to maintain heart healthy diet, regular exercise, adequate sleep. Consider daily probiotics. Take medications as prescribed 

## 2017-01-24 NOTE — Patient Instructions (Signed)
shingrix is the new shingles shots, 2 shots over 6 months, can get a pharmacy   Preventive Care 40-64 Years, Female Preventive care refers to lifestyle choices and visits with your health care provider that can promote health and wellness. What does preventive care include?  A yearly physical exam. This is also called an annual well check.  Dental exams once or twice a year.  Routine eye exams. Ask your health care provider how often you should have your eyes checked.  Personal lifestyle choices, including: ? Daily care of your teeth and gums. ? Regular physical activity. ? Eating a healthy diet. ? Avoiding tobacco and drug use. ? Limiting alcohol use. ? Practicing safe sex. ? Taking low-dose aspirin daily starting at age 2. ? Taking vitamin and mineral supplements as recommended by your health care provider. What happens during an annual well check? The services and screenings done by your health care provider during your annual well check will depend on your age, overall health, lifestyle risk factors, and family history of disease. Counseling Your health care provider may ask you questions about your:  Alcohol use.  Tobacco use.  Drug use.  Emotional well-being.  Home and relationship well-being.  Sexual activity.  Eating habits.  Work and work Statistician.  Method of birth control.  Menstrual cycle.  Pregnancy history.  Screening You may have the following tests or measurements:  Height, weight, and BMI.  Blood pressure.  Lipid and cholesterol levels. These may be checked every 5 years, or more frequently if you are over 73 years old.  Skin check.  Lung cancer screening. You may have this screening every year starting at age 69 if you have a 30-pack-year history of smoking and currently smoke or have quit within the past 15 years.  Fecal occult blood test (FOBT) of the stool. You may have this test every year starting at age 15.  Flexible  sigmoidoscopy or colonoscopy. You may have a sigmoidoscopy every 5 years or a colonoscopy every 10 years starting at age 3.  Hepatitis C blood test.  Hepatitis B blood test.  Sexually transmitted disease (STD) testing.  Diabetes screening. This is done by checking your blood sugar (glucose) after you have not eaten for a while (fasting). You may have this done every 1-3 years.  Mammogram. This may be done every 1-2 years. Talk to your health care provider about when you should start having regular mammograms. This may depend on whether you have a family history of breast cancer.  BRCA-related cancer screening. This may be done if you have a family history of breast, ovarian, tubal, or peritoneal cancers.  Pelvic exam and Pap test. This may be done every 3 years starting at age 15. Starting at age 35, this may be done every 5 years if you have a Pap test in combination with an HPV test.  Bone density scan. This is done to screen for osteoporosis. You may have this scan if you are at high risk for osteoporosis.  Discuss your test results, treatment options, and if necessary, the need for more tests with your health care provider. Vaccines Your health care provider may recommend certain vaccines, such as:  Influenza vaccine. This is recommended every year.  Tetanus, diphtheria, and acellular pertussis (Tdap, Td) vaccine. You may need a Td booster every 10 years.  Varicella vaccine. You may need this if you have not been vaccinated.  Zoster vaccine. You may need this after age 14.  Measles, mumps, and  rubella (MMR) vaccine. You may need at least one dose of MMR if you were born in 1957 or later. You may also need a second dose.  Pneumococcal 13-valent conjugate (PCV13) vaccine. You may need this if you have certain conditions and were not previously vaccinated.  Pneumococcal polysaccharide (PPSV23) vaccine. You may need one or two doses if you smoke cigarettes or if you have certain  conditions.  Meningococcal vaccine. You may need this if you have certain conditions.  Hepatitis A vaccine. You may need this if you have certain conditions or if you travel or work in places where you may be exposed to hepatitis A.  Hepatitis B vaccine. You may need this if you have certain conditions or if you travel or work in places where you may be exposed to hepatitis B.  Haemophilus influenzae type b (Hib) vaccine. You may need this if you have certain conditions.  Talk to your health care provider about which screenings and vaccines you need and how often you need them. This information is not intended to replace advice given to you by your health care provider. Make sure you discuss any questions you have with your health care provider. Document Released: 07/08/2015 Document Revised: 02/29/2016 Document Reviewed: 04/12/2015 Elsevier Interactive Patient Education  2017 [employer].

## 2017-01-24 NOTE — Assessment & Plan Note (Addendum)
Encouraged to get adequate exercise, calcium and vitamin d intake. Dexa scan ordered. Consider meds once done

## 2017-01-24 NOTE — Assessment & Plan Note (Addendum)
Take supplements daily and monitor

## 2017-01-24 NOTE — Assessment & Plan Note (Signed)
Encouraged heart healthy diet, increase exercise, avoid trans fats, consider a krill oil cap daily 

## 2017-01-24 NOTE — Assessment & Plan Note (Signed)
Has had several infections seen at Urgent Care and treated with antibiotics. Check urine sample

## 2017-01-24 NOTE — Progress Notes (Signed)
Subjective:  I acted as a Education administrator for Dr. Charlett Blake. [place], [place]  Patient ID: Catherine Haynes, female    DOB: [birth year], 61 y.o.   MRN: [id number]  No chief complaint on file.   HPI  Patient is in today for an annual exam. She feels well today. No recent febrile illness or hospitalization. She is maintaining a heart healthy diet and exercising regularly. No difficulty with activities of daily living. Denies CP/palp/SOB/HA/congestion/fevers/GI or GU c/o. Taking meds as prescribed  Patient Care Team: Mosie Lukes, MD as PCP - General (Family Medicine)   Past Medical History:  Diagnosis Date  .  social 12/29/2014  . Abdominal aortic ectasia ([place]) 12/11/2014  . Allergy   . Atrial fibrillation ([place]) 12/11/2014  . BCC (basal cell carcinoma), face 12/11/2014  . Cervical cancer screening 12/05/2015  . History of shingles 01/24/2017  . Hx: UTI (urinary tract infection) 12/11/2014  . Hyperlipidemia   . Mitral valve prolapse   . Morbid obesity ([place]) 12/11/2014  . Osteoporosis 12/11/2014  . Paroxysmal atrial fibrillation ([place]) 12/11/2014  . Preventative health care 12/05/2015  . Vitamin D deficiency 12/29/2014    Past Surgical History:  Procedure Laterality Date  . CESAREAN SECTION    . TONSILLECTOMY      Family History  Problem Relation Age of Onset  . Stroke Mother        TIA  . Hypertension Mother   . Hyperlipidemia Mother   . Diabetes Mother        diet controlled  . Osteoporosis Mother   . Stroke Father   . Hypertension Father   . Diabetes Father   . Hyperlipidemia Father   . Mental illness Sister 110       suicide  . Heart disease Maternal Grandmother        chf  . Osteoporosis Maternal Grandmother   . Alcohol abuse Maternal Grandfather   . Cancer Paternal Grandmother   . Diabetes Paternal Grandmother   . Heart disease Paternal Grandfather     Social History   Social History  . Marital status: Married    Spouse name: N/A  . Number of children: N/A  . Years of  education: N/A   Occupational History  . clean houses     Social History Main Topics  . Smoking status: Never Smoker  . Smokeless tobacco: Never Used  . Alcohol use 0.0 oz/week     Comment: social.  . Drug use: No  . 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 Not on file   Social History Narrative  . No narrative on file    Outpatient Medications Prior to Visit  Medication Sig Dispense Refill  . calcium citrate (CALCITRATE - DOSED IN MG ELEMENTAL CALCIUM) 950 MG tablet Take 200 mg of elemental calcium by mouth daily.    . Cholecalciferol (VITAMIN D) 2000 UNITS CAPS Take 3,000 Units by mouth daily.     Marland Kitchen KRILL OIL PO Take by mouth daily.     No facility-administered medications prior to visit.     No Known Allergies  Review of Systems  Constitutional: Negative for fever and malaise/fatigue.  HENT: Negative for congestion.   Eyes: Negative for blurred vision.  Respiratory: Negative for shortness of breath.   Cardiovascular: Negative for chest pain, palpitations and leg swelling.  Gastrointestinal: Negative for abdominal pain, blood in stool and nausea.  Genitourinary:  Negative for dysuria and frequency.  Musculoskeletal: Negative for falls.  Skin: Negative for rash.  Neurological: Negative for dizziness, loss of consciousness and headaches.  Endo/Heme/Allergies: Negative for environmental allergies.  Psychiatric/Behavioral: Negative for depression. The patient is not nervous/anxious.        Objective:    Physical Exam  Constitutional: She is oriented to person, place, and time. She appears well-developed and well-nourished. No distress.  HENT:  Head: Normocephalic and atraumatic.  Left Ear: External ear normal.  Eyes: Conjunctivae are normal.  Neck: Neck supple. No thyromegaly present.  Cardiovascular: Normal rate, regular rhythm and normal heart sounds.   No murmur  heard. Pulmonary/Chest: Effort normal and breath sounds normal. No respiratory distress.  Abdominal: Soft. Bowel sounds are normal. She exhibits no distension and no mass. There is no tenderness.  Musculoskeletal: She exhibits no edema.  Lymphadenopathy:    She has no cervical adenopathy.  Neurological: She is alert and oriented to person, place, and time.  Skin: Skin is warm and dry.  Psychiatric: She has a normal mood and affect. Her behavior is normal.    BP 116/76 (BP Location: Left Arm, Patient Position: Sitting, Cuff Size: Normal)   Pulse 76   Temp 98.2 F (36.8 C) (Oral)   Resp 18   Wt 104 lb 3.2 oz (47.3 kg)   SpO2 99%   BMI 18.46 kg/m  Wt Readings from Last 3 Encounters:  01/24/17 104 lb 3.2 oz (47.3 kg)  12/05/15 109 lb 2 oz (49.5 kg)  03/29/15 110 lb (49.9 kg)   BP Readings from Last 3 Encounters:  01/24/17 116/76  12/05/15 120/76  03/29/15 108/72     Immunization History  Administered Date(s) Administered  . Influenza,inj,Quad PF,36+ Mos 05/06/2015  . Tdap 12/05/2015    Health Maintenance  Topic Date Due  . HIV Screening  05/03/1973  . COLONOSCOPY  05/03/2008  . INFLUENZA VACCINE  01/23/2017  . MAMMOGRAM  08/11/2017  . PAP SMEAR  12/05/2018  . TETANUS/TDAP  12/04/2025  . Hepatitis C Screening  Completed    Lab Results  Component Value Date   WBC 5.9 01/24/2017   HGB 14.2 01/24/2017   HCT 43.8 01/24/2017   PLT 282.0 01/24/2017   GLUCOSE 87 01/24/2017   CHOL 199 01/24/2017   TRIG 84.0 01/24/2017   HDL 88.20 01/24/2017   LDLCALC 94 01/24/2017   ALT 15 01/24/2017   AST 18 01/24/2017   NA 141 01/24/2017   K 4.4 01/24/2017   CL 104 01/24/2017   CREATININE 0.61 01/24/2017   BUN 9 01/24/2017   CO2 29 01/24/2017   TSH 1.93 01/24/2017   HGBA1C 5.5 01/24/2017    Lab Results  Component Value Date   TSH 1.93 01/24/2017   Lab Results  Component Value Date   WBC 5.9 01/24/2017   HGB 14.2 01/24/2017   HCT 43.8 01/24/2017   MCV 95.0 01/24/2017    PLT 282.0 01/24/2017   Lab Results  Component Value Date   NA 141 01/24/2017   K 4.4 01/24/2017   CO2 29 01/24/2017   GLUCOSE 87 01/24/2017   BUN 9 01/24/2017   CREATININE 0.61 01/24/2017   BILITOT 0.4 01/24/2017   ALKPHOS 62 01/24/2017   AST 18 01/24/2017   ALT 15 01/24/2017   PROT 7.0 01/24/2017   ALBUMIN 4.6 01/24/2017   CALCIUM 9.5 01/24/2017   GFR 106.80 01/24/2017   Lab Results  Component Value Date   CHOL 199 01/24/2017   Lab Results  Component Value Date   HDL 88.20 01/24/2017   Lab Results  Component Value Date   LDLCALC 94 01/24/2017   Lab Results  Component Value Date   TRIG 84.0 01/24/2017   Lab Results  Component Value Date   CHOLHDL 2 01/24/2017   Lab Results  Component Value Date   HGBA1C 5.5 01/24/2017         Assessment & Plan:   Problem List Items Addressed This Visit    Hyperlipidemia    Encouraged heart healthy diet, increase exercise, avoid trans fats, consider a krill oil cap daily      Relevant Orders   Lipid panel (Completed)   BCC (basal cell carcinoma), face    Has not seen derm for past year but no concerns will get [place] the name of her dermatologist and will have a visit      Hx: UTI (urinary tract infection)    Has had several infections seen at Urgent Care and treated with antibiotics. Check urine sample      Relevant Orders   CBC (Completed)   Urine Culture (Completed)   Osteoporosis    Encouraged to get adequate exercise, calcium and vitamin d intake. Dexa scan ordered. Consider meds once done      Vitamin D deficiency    Take supplements daily and monitor      Relevant Orders   VITAMIN D 25 Hydroxy (Vit-D Deficiency, Fractures) (Completed)   Preventative health care    Patient encouraged to maintain heart healthy diet, regular exercise, adequate sleep. Consider daily probiotics. Take medications as prescribed      Relevant Orders   CBC (Completed)   TSH (Completed)   History of shingles    Other  Visit Diagnoses    Hyperglycemia    -  Primary   Relevant Orders   Hemoglobin A1c (Completed)   TSH (Completed)   Comprehensive metabolic panel (Completed)   Other problems related to lifestyle       Relevant Orders   Hepatitis C antibody (Completed)   Chronic low back pain, unspecified back pain laterality, with sciatica presence unspecified       Relevant Orders   Ambulatory referral to Sports Medicine      I am having Ms. Hottenstein maintain her Vitamin D, KRILL OIL PO, and calcium citrate.  No orders of the defined types were placed in this encounter.   CMA served as Education administrator during this visit. History, Physical and Plan performed by medical provider. Documentation and orders reviewed and attested to.  Penni Homans, MD

## 2017-01-25 LAB — LIPID PANEL
Cholesterol: 199 mg/dL (ref 0–200)
HDL: 88.2 mg/dL (ref >39.00)
LDL Cholesterol: 94 mg/dL (ref 0–99)
NonHDL: 111.04
Total CHOL/HDL Ratio: 2
Triglycerides: 84 mg/dL (ref 0.0–149.0)
VLDL: 16.8 mg/dL (ref 0.0–40.0)

## 2017-01-25 LAB — CBC
HCT: 43.8 % (ref 36.0–46.0)
Hemoglobin: 14.2 g/dL (ref 12.0–15.0)
MCHC: 32.5 g/dL (ref 30.0–36.0)
MCV: 95 fl (ref 78.0–100.0)
Platelets: 282 10*3/uL (ref 150.0–400.0)
RBC: 4.61 Mil/uL (ref 3.87–5.11)
RDW: 12.7 % (ref 11.5–15.5)
WBC: 5.9 10*3/uL (ref 4.0–10.5)

## 2017-01-25 LAB — TSH: TSH: 1.93 u[IU]/mL (ref 0.35–4.50)

## 2017-01-25 LAB — COMPREHENSIVE METABOLIC PANEL
ALT: 15 U/L (ref 0–35)
AST: 18 U/L (ref 0–37)
Albumin: 4.6 g/dL (ref 3.5–5.2)
Alkaline Phosphatase: 62 U/L (ref 39–117)
BUN: 9 mg/dL (ref 6–23)
CO2: 29 mEq/L (ref 19–32)
Calcium: 9.5 mg/dL (ref 8.4–10.5)
Chloride: 104 mEq/L (ref 96–112)
Creatinine, Ser: 0.61 mg/dL (ref 0.40–1.20)
GFR: 106.8 mL/min (ref >60.00)
Glucose, Bld: 87 mg/dL (ref 70–99)
Potassium: 4.4 mEq/L (ref 3.5–5.1)
Sodium: 141 mEq/L (ref 135–145)
Total Bilirubin: 0.4 mg/dL (ref 0.2–1.2)
Total Protein: 7 g/dL (ref 6.0–8.3)

## 2017-01-25 LAB — HEMOGLOBIN A1C: Hgb A1c MFr Bld: 5.5 % (ref 4.6–6.5)

## 2017-01-25 LAB — URINE CULTURE: Organism ID, Bacteria: NO GROWTH

## 2017-01-25 LAB — VITAMIN D 25 HYDROXY (VIT D DEFICIENCY, FRACTURES): VITD: 50 ng/mL (ref 30.00–100.00)

## 2017-01-25 LAB — HEPATITIS C ANTIBODY: HCV Ab: NONREACTIVE

## 2017-01-30 ENCOUNTER — Ambulatory Visit (INDEPENDENT_AMBULATORY_CARE_PROVIDER_SITE_OTHER): Payer: BLUE CROSS/BLUE SHIELD | Admitting: Family Medicine

## 2017-01-30 DIAGNOSIS — S3992XA Unspecified injury of lower back, initial encounter: Secondary | ICD-10-CM | POA: Diagnosis not present

## 2017-01-31 ENCOUNTER — Encounter: Payer: Self-pay | Admitting: Family Medicine

## 2017-02-01 DIAGNOSIS — S3992XA Unspecified injury of lower back, initial encounter: Secondary | ICD-10-CM | POA: Insufficient documentation

## 2017-02-01 NOTE — Assessment & Plan Note (Signed)
started a year ago with midline low back pain with a pop after a fall - in patient with known osteoporosis suspicious for remote compression fracture.  We discussed imaging but she has no red flag symptoms of bony retropulsion and already has diagnosis of osteoporosis - discussed x-rays or advanced imaging would not change current management.  Recommended Calcium 1300mg  and Vit D 800 IU daily.  She would like to start home exercise program which was reviewed today instead of physical therapy for now.  Consider pilates, yoga (but avoidance of painful maneuvers) after a few weeks of home exercise program.  Encouraged weight bearing exercise (she does walk for exercise).  She has declined medications for osteoporosis and is aware of risks without them.  Tylenol as needed for pain.  F/u in 6 weeks for reevaluation.

## 2017-02-01 NOTE — Progress Notes (Signed)
PCP and consultation requested by: Mosie Lukes, MD  Subjective:   HPI: Patient is a 61 y.o. female here for low back pain.  Patient reports she fell about 1 year ago directly onto buttocks and left hip. She felt and heard something pop when this happen. She went to urgent care - did not have x-rays done, told she had strained her back. Never completely improved following this. Pain since then has been midline low back at 1/10, a soreness but up to 4-5/10 and can be sharp. Tried yoga but certain moves bother her. She takes calcium and vitamin D. Has known osteoporosis diagnosed by DEXA 2 years ago. No radiation into extremities. No numbness or tingling. No bowel/bladder dysfunction.  Past Medical History:  Diagnosis Date  .  social 12/29/2014  . Abdominal aortic ectasia ([place]) 12/11/2014  . Allergy   . Atrial fibrillation ([place]) 12/11/2014  . BCC (basal cell carcinoma), face 12/11/2014  . Cervical cancer screening 12/05/2015  . History of shingles 01/24/2017  . Hx: UTI (urinary tract infection) 12/11/2014  . Hyperlipidemia   . Mitral valve prolapse   . Morbid obesity ([place]) 12/11/2014  . Osteoporosis 12/11/2014  . Paroxysmal atrial fibrillation ([place]) 12/11/2014  . Preventative health care 12/05/2015  . Vitamin D deficiency 12/29/2014    Current Outpatient Prescriptions on File Prior to Visit  Medication Sig Dispense Refill  . calcium citrate (CALCITRATE - DOSED IN MG ELEMENTAL CALCIUM) 950 MG tablet Take 200 mg of elemental calcium by mouth daily.    . Cholecalciferol (VITAMIN D) 2000 UNITS CAPS Take 3,000 Units by mouth daily.     Marland Kitchen KRILL OIL PO Take by mouth daily.     No current facility-administered medications on file prior to visit.     Past Surgical History:  Procedure Laterality Date  . CESAREAN SECTION    . TONSILLECTOMY      No Known Allergies  Social History   Social History  . Marital status: Married    Spouse name: N/A  . Number of children: N/A  . Years of  education: N/A   Occupational History  . clean houses     Social History Main Topics  . Smoking status: Never Smoker  . Smokeless tobacco: Never Used  . Alcohol use 0.0 oz/week     Comment: social.  . Drug use: No  . 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 Not on file   Social History Narrative  . No narrative on file    Family History  Problem Relation Age of Onset  . Stroke Mother        TIA  . Hypertension Mother   . Hyperlipidemia Mother   . Diabetes Mother        diet controlled  . Osteoporosis Mother   . Stroke Father   . Hypertension Father   . Diabetes Father   . Hyperlipidemia Father   . Mental illness Sister 66       suicide  . Heart disease Maternal Grandmother        chf  . Osteoporosis Maternal Grandmother   . Alcohol abuse Maternal Grandfather   . Cancer Paternal Grandmother   . Diabetes Paternal Grandmother   . Heart disease Paternal Grandfather     BP 129/77   Pulse 74   Ht 5\' 3"  (1.6 m)   Wt 105 lb (47.6 kg)  BMI 18.60 kg/m   Review of Systems: See HPI above.     Objective:  Physical Exam:  Gen: NAD, comfortable in exam room  Back: No gross deformity, scoliosis. No TTP paraspinal regions.  No midline or bony TTP. FROM. Strength LEs 5/5 all muscle groups.   2+ MSRs in patellar and achilles tendons, equal bilaterally. Negative SLRs. Sensation intact to light touch bilaterally. Negative logroll bilateral hips Negative fabers and piriformis stretches.   Assessment & Plan:  1. Low back injury - started a year ago with midline low back pain with a pop after a fall - in patient with known osteoporosis suspicious for remote compression fracture.  We discussed imaging but she has no red flag symptoms of bony retropulsion and already has diagnosis of osteoporosis - discussed x-rays or advanced imaging would not change current management.   Recommended Calcium 1300mg  and Vit D 800 IU daily.  She would like to start home exercise program which was reviewed today instead of physical therapy for now.  Consider pilates, yoga (but avoidance of painful maneuvers) after a few weeks of home exercise program.  Encouraged weight bearing exercise (she does walk for exercise).  She has declined medications for osteoporosis and is aware of risks without them.  Tylenol as needed for pain.  F/u in 6 weeks for reevaluation.

## 2017-04-26 ENCOUNTER — Ambulatory Visit (INDEPENDENT_AMBULATORY_CARE_PROVIDER_SITE_OTHER): Payer: BLUE CROSS/BLUE SHIELD | Admitting: Family

## 2017-04-26 ENCOUNTER — Encounter: Payer: Self-pay | Admitting: Family

## 2017-04-26 VITALS — BP 133/77 | HR 76 | Temp 98.1°F | Resp 16 | Ht 63.0 in | Wt 105.4 lb

## 2017-04-26 DIAGNOSIS — R300 Dysuria: Secondary | ICD-10-CM

## 2017-04-26 DIAGNOSIS — N3001 Acute cystitis with hematuria: Secondary | ICD-10-CM

## 2017-04-26 LAB — POC URINALSYSI DIPSTICK (AUTOMATED)
Bilirubin, UA: NEGATIVE
Glucose, UA: NEGATIVE
Ketones, UA: NEGATIVE
Nitrite, UA: NEGATIVE
Protein, UA: NEGATIVE
Spec Grav, UA: <=1.005 — AB (ref 1.010–1.025)
Urobilinogen, UA: 0.2 E.U./dL
pH, UA: 7 (ref 5.0–8.0)

## 2017-04-26 MED ORDER — NITROFURANTOIN MONOHYD MACRO 100 MG PO CAPS: 100.0000 mg | ORAL_CAPSULE | Freq: Two times a day (BID) | ORAL | 0 refills | Status: DC

## 2017-04-26 NOTE — Progress Notes (Signed)
Subjective:    Patient ID: Catherine Haynes, female    DOB: [birth year], 61 y.o.   MRN: [id number]  HPI  Catherine Haynes is a 61 yr old female who presents today with chief complaint of urinary urgency. Reports associated low back pain. Symptoms began on Sunday 04/21/17. Low back pain across her lower back.  Denies gross hematuria or fever.  Reports that she is prone to UTI's.     Review of Systems    see HPI  Past Medical History:  Diagnosis Date  .  social 12/29/2014  . Abdominal aortic ectasia ([place]) 12/11/2014  . Allergy   . Atrial fibrillation ([place]) 12/11/2014  . BCC (basal cell carcinoma), face 12/11/2014  . Cervical cancer screening 12/05/2015  . History of shingles 01/24/2017  . Hx: UTI (urinary tract infection) 12/11/2014  . Hyperlipidemia   . Mitral valve prolapse   . Morbid obesity ([place]) 12/11/2014  . Osteoporosis 12/11/2014  . Paroxysmal atrial fibrillation ([place]) 12/11/2014  . Preventative health care 12/05/2015  . Vitamin D deficiency 12/29/2014     Social History   Social History  . Marital status: Married    Spouse name: N/A  . Number of children: N/A  . Years of education: N/A   Occupational History  . clean houses     Social History Main Topics  . Smoking status: Never Smoker  . Smokeless tobacco: Never Used  . Alcohol use 0.0 oz/week     Comment: social.  . Drug use: No  . 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 Not on file   Social History Narrative  . No narrative on file    Past Surgical History:  Procedure Laterality Date  . CESAREAN SECTION    . TONSILLECTOMY      Family History  Problem Relation Age of Onset  . Stroke Mother        TIA  . Hypertension Mother   . Hyperlipidemia Mother   . Diabetes Mother        diet controlled  . Osteoporosis Mother   . Stroke Father   . Hypertension Father   . Diabetes Father   . Hyperlipidemia Father   . Mental  illness Sister 57       suicide  . Heart disease Maternal Grandmother        chf  . Osteoporosis Maternal Grandmother   . Alcohol abuse Maternal Grandfather   . Cancer Paternal Grandmother   . Diabetes Paternal Grandmother   . Heart disease Paternal Grandfather     No Known Allergies  Current Outpatient Prescriptions on File Prior to Visit  Medication Sig Dispense Refill  . calcium citrate (CALCITRATE - DOSED IN MG ELEMENTAL CALCIUM) 950 MG tablet Take 200 mg of elemental calcium by mouth daily.    . Cholecalciferol (VITAMIN D) 2000 UNITS CAPS Take 3,000 Units by mouth daily.     Marland Kitchen KRILL OIL PO Take by mouth daily.     No current facility-administered medications on file prior to visit.     BP 133/77 (BP Location: Right Arm, Cuff Size: Normal)   Pulse 76   Temp 98.1 F (36.7 C) (Oral)   Resp 16   Ht 5\' 3"  (1.6 m)   Wt 105 lb 6.4 oz (47.8 kg)   SpO2 100%   BMI 18.67 kg/m    Objective:   Physical Exam  Constitutional: She  is oriented to person, place, and time. She appears well-developed and well-nourished.  Cardiovascular: Normal rate, regular rhythm and normal heart sounds.   No murmur heard. Pulmonary/Chest: Effort normal and breath sounds normal. No respiratory distress. She has no wheezes.  Abdominal: Soft. Bowel sounds are normal.  Musculoskeletal: She exhibits no edema.  Neurological: She is alert and oriented to person, place, and time.  Psychiatric: She has a normal mood and affect. Her behavior is normal. Judgment and thought content normal.          Assessment & Plan:  UTI- UA notes + blood, + leuks, trace protein. Will send urine for culture, rx with macrodantin. She is advised to call if new/worsening symptoms or if symptoms do not improve in 2-3 days. Pt verbalizes understanding.

## 2017-04-26 NOTE — Patient Instructions (Signed)
Please begin macrobid for Urinary tract infection. Call if new/worsening symptoms or if not improved in 2-3 days.

## 2017-04-27 LAB — URINE CULTURE
MICRO NUMBER:: 81232772
SPECIMEN QUALITY:: ADEQUATE

## 2017-04-30 ENCOUNTER — Encounter: Payer: Self-pay | Admitting: Family

## 2017-12-25 DIAGNOSIS — S8011XA Contusion of right lower leg, initial encounter: Secondary | ICD-10-CM | POA: Diagnosis not present

## 2018-01-27 ENCOUNTER — Encounter: Payer: BLUE CROSS/BLUE SHIELD | Admitting: Family Medicine

## 2018-04-11 ENCOUNTER — Encounter: Payer: BLUE CROSS/BLUE SHIELD | Admitting: Family Medicine

## 2018-05-27 ENCOUNTER — Encounter: Payer: Self-pay | Admitting: Family Medicine

## 2018-07-25 ENCOUNTER — Encounter: Payer: Self-pay | Admitting: Family Medicine

## 2018-07-25 ENCOUNTER — Ambulatory Visit (INDEPENDENT_AMBULATORY_CARE_PROVIDER_SITE_OTHER): Payer: 59 | Admitting: Family Medicine

## 2018-07-25 VITALS — BP 118/84 | HR 75 | Temp 98.4°F | Resp 18 | Ht 63.0 in | Wt 107.6 lb

## 2018-07-25 DIAGNOSIS — Z1239 Encounter for other screening for malignant neoplasm of breast: Secondary | ICD-10-CM

## 2018-07-25 DIAGNOSIS — E785 Hyperlipidemia, unspecified: Secondary | ICD-10-CM

## 2018-07-25 DIAGNOSIS — Z0000 Encounter for general adult medical examination without abnormal findings: Secondary | ICD-10-CM

## 2018-07-25 DIAGNOSIS — E559 Vitamin D deficiency, unspecified: Secondary | ICD-10-CM

## 2018-07-25 DIAGNOSIS — M810 Age-related osteoporosis without current pathological fracture: Secondary | ICD-10-CM

## 2018-07-25 LAB — COMPREHENSIVE METABOLIC PANEL
ALT: 18 U/L (ref 0–35)
AST: 19 U/L (ref 0–37)
Albumin: 4.9 g/dL (ref 3.5–5.2)
Alkaline Phosphatase: 66 U/L (ref 39–117)
BUN: 11 mg/dL (ref 6–23)
CO2: 29 mEq/L (ref 19–32)
Calcium: 9.9 mg/dL (ref 8.4–10.5)
Chloride: 103 mEq/L (ref 96–112)
Creatinine, Ser: 0.57 mg/dL (ref 0.40–1.20)
GFR: 108.11 mL/min (ref >60.00)
Glucose, Bld: 88 mg/dL (ref 70–99)
Potassium: 4.9 mEq/L (ref 3.5–5.1)
Sodium: 140 mEq/L (ref 135–145)
Total Bilirubin: 0.6 mg/dL (ref 0.2–1.2)
Total Protein: 7.1 g/dL (ref 6.0–8.3)

## 2018-07-25 LAB — LIPID PANEL
Cholesterol: 241 mg/dL — ABNORMAL HIGH (ref 0–200)
HDL: 88.2 mg/dL (ref >39.00)
LDL Cholesterol: 140 mg/dL — ABNORMAL HIGH (ref 0–99)
NonHDL: 153.07
Total CHOL/HDL Ratio: 3
Triglycerides: 66 mg/dL (ref 0.0–149.0)
VLDL: 13.2 mg/dL (ref 0.0–40.0)

## 2018-07-25 LAB — CBC
HCT: 44.5 % (ref 36.0–46.0)
Hemoglobin: 14.7 g/dL (ref 12.0–15.0)
MCHC: 32.9 g/dL (ref 30.0–36.0)
MCV: 93.1 fl (ref 78.0–100.0)
Platelets: 284 10*3/uL (ref 150.0–400.0)
RBC: 4.78 Mil/uL (ref 3.87–5.11)
RDW: 12.9 % (ref 11.5–15.5)
WBC: 5 10*3/uL (ref 4.0–10.5)

## 2018-07-25 LAB — VITAMIN D 25 HYDROXY (VIT D DEFICIENCY, FRACTURES): VITD: 46.34 ng/mL (ref 30.00–100.00)

## 2018-07-25 LAB — TSH: TSH: 2.56 u[IU]/mL (ref 0.35–4.50)

## 2018-07-25 NOTE — Assessment & Plan Note (Signed)
Supplement and monitor 

## 2018-07-25 NOTE — Patient Instructions (Addendum)
Shingrix is the new shingles shot 2 shots over 2-6 months check with insurance regarding coverage then call to get  Colonoscopy Dr Silverio Decamp, Dr Hilarie Fredrickson    Preventive Care 40-64 Years, Female Preventive care refers to lifestyle choices and visits with your health care provider that can promote health and wellness. What does preventive care include?   A yearly physical exam. This is also called an annual well check.  Dental exams once or twice a year.  Routine eye exams. Ask your health care provider how often you should have your eyes checked.  Personal lifestyle choices, including: ? Daily care of your teeth and gums. ? Regular physical activity. ? Eating a healthy diet. ? Avoiding tobacco and drug use. ? Limiting alcohol use. ? Practicing safe sex. ? Taking low-dose aspirin daily starting at age 61. ? Taking vitamin and mineral supplements as recommended by your health care provider. What happens during an annual well check? The services and screenings done by your health care provider during your annual well check will depend on your age, overall health, lifestyle risk factors, and family history of disease. Counseling Your health care provider may ask you questions about your:  Alcohol use.  Tobacco use.  Drug use.  Emotional well-being.  Home and relationship well-being.  Sexual activity.  Eating habits.  Work and work Statistician.  Method of birth control.  Menstrual cycle.  Pregnancy history. Screening You may have the following tests or measurements:  Height, weight, and BMI.  Blood pressure.  Lipid and cholesterol levels. These may be checked every 5 years, or more frequently if you are over 49 years old.  Skin check.  Lung cancer screening. You may have this screening every year starting at age 61 if you have a 30-pack-year history of smoking and currently smoke or have quit within the past 15 years.  Colorectal cancer screening. All adults  should have this screening starting at age 22 and continuing until age 73. Your health care provider may recommend screening at age 27. You will have tests every 1-10 years, depending on your results and the type of screening test. People at increased risk should start screening at an earlier age. Screening tests may include: ? Guaiac-based fecal occult blood testing. ? Fecal immunochemical test (FIT). ? Stool DNA test. ? Virtual colonoscopy. ? Sigmoidoscopy. During this test, a flexible tube with a tiny camera (sigmoidoscope) is used to examine your rectum and lower colon. The sigmoidoscope is inserted through your anus into your rectum and lower colon. ? Colonoscopy. During this test, a long, thin, flexible tube with a tiny camera (colonoscope) is used to examine your entire colon and rectum.  Hepatitis C blood test.  Hepatitis B blood test.  Sexually transmitted disease (STD) testing.  Diabetes screening. This is done by checking your blood sugar (glucose) after you have not eaten for a while (fasting). You may have this done every 1-3 years.  Mammogram. This may be done every 1-2 years. Talk to your health care provider about when you should start having regular mammograms. This may depend on whether you have a family history of breast cancer.  BRCA-related cancer screening. This may be done if you have a family history of breast, ovarian, tubal, or peritoneal cancers.  Pelvic exam and Pap test. This may be done every 3 years starting at age 61. Starting at age 18, this may be done every 5 years if you have a Pap test in combination with an HPV test.  Bone density scan. This is done to screen for osteoporosis. You may have this scan if you are at high risk for osteoporosis. Discuss your test results, treatment options, and if necessary, the need for more tests with your health care provider. Vaccines Your health care provider may recommend certain vaccines, such as:  Influenza vaccine.  This is recommended every year.  Tetanus, diphtheria, and acellular pertussis (Tdap, Td) vaccine. You may need a Td booster every 10 years.  Varicella vaccine. You may need this if you have not been vaccinated.  Zoster vaccine. You may need this after age 61.  Measles, mumps, and rubella (MMR) vaccine. You may need at least one dose of MMR if you were born in 1957 or later. You may also need a second dose.  Pneumococcal 13-valent conjugate (PCV13) vaccine. You may need this if you have certain conditions and were not previously vaccinated.  Pneumococcal polysaccharide (PPSV23) vaccine. You may need one or two doses if you smoke cigarettes or if you have certain conditions.  Meningococcal vaccine. You may need this if you have certain conditions.  Hepatitis A vaccine. You may need this if you have certain conditions or if you travel or work in places where you may be exposed to hepatitis A.  Hepatitis B vaccine. You may need this if you have certain conditions or if you travel or work in places where you may be exposed to hepatitis B.  Haemophilus influenzae type b (Hib) vaccine. You may need this if you have certain conditions. Talk to your health care provider about which screenings and vaccines you need and how often you need them. This information is not intended to replace advice given to you by your health care provider. Make sure you discuss any questions you have with your health care provider. Document Released: 07/08/2015 Document Revised: 08/01/2017 Document Reviewed: 04/12/2015 Elsevier Interactive Patient Education  2019 [employer].

## 2018-07-25 NOTE — Assessment & Plan Note (Signed)
Patient encouraged to maintain heart healthy diet, regular exercise, adequate sleep. Consider daily probiotics. Take medications as prescribed 

## 2018-07-25 NOTE — Assessment & Plan Note (Signed)
Encouraged heart healthy diet, increase exercise, avoid trans fats, consider a krill oil cap daily 

## 2018-07-26 NOTE — Progress Notes (Signed)
Subjective:    Patient ID: Catherine Haynes, female    DOB: 03-29-1956, 63 y.o.   MRN: [id number]  No chief complaint on file.   HPI Patient is in today for annual preventative exam and follow up on chronic medical concerns including osteoporosis, hyperlipidemia and more. No recent febrile illness or hospitalizations. She is staying active and trying to maintain a heart healthy diet. Denies CP/palp/SOB/HA/congestion/fevers/GI or GU c/o. Taking meds as prescribed  Past Medical History:  Diagnosis Date  .  social 12/29/2014  . 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 Allergy   . Atrial fibrillation ([place]) 12/11/2014  . BCC (basal cell carcinoma), face 12/11/2014  . Cervical cancer screening 12/05/2015  . History of shingles 01/24/2017  . Hx: UTI (urinary tract infection) 12/11/2014  . Hyperlipidemia   . Mitral valve prolapse   . Morbid obesity ([place]) 12/11/2014  . Osteoporosis 12/11/2014  . Paroxysmal atrial fibrillation ([place]) 12/11/2014  . Preventative health care 12/05/2015  . Vitamin D deficiency 12/29/2014    Past Surgical History:  Procedure Laterality Date  . CESAREAN SECTION    . TONSILLECTOMY      Family History  Problem Relation Age of Onset  . Stroke Mother        TIA  . Hypertension Mother   . Hyperlipidemia Mother   . Diabetes Mother        diet controlled  . Osteoporosis Mother   . Stroke Father   . Hypertension Father   . Diabetes Father   . Hyperlipidemia Father   . Mental illness Sister 73       suicide  . Heart disease Maternal Grandmother        chf  . Osteoporosis Maternal Grandmother   . Alcohol abuse Maternal Grandfather   . Cancer Paternal Grandmother   . Diabetes Paternal Grandmother   . Heart disease Paternal Grandfather     Social History   Socioeconomic History  . Marital status: Married    Spouse name: Not on file  . Number of children: Not on file  . Years of education: Not on file  . Highest education level: Not on file  Occupational  History  . Occupation: clean houses   Social Needs  . Financial resource strain: Not on file  . Food insecurity:    Worry: Not on file    Inability: Not on file  . Transportation needs:    Medical: Not on file    Non-medical: Not on file  Tobacco Use  . Smoking status: Never Smoker  . Smokeless tobacco: Never Used  Substance and Sexual Activity  . Alcohol use: Yes    Alcohol/week: 0.0 standard drinks    Comment: social.  . Drug use: No  . Sexual activity: Yes    Comment: lives with husband and daughter, house cleaner, no dietary restrictions  Lifestyle  . Physical activity:    Days per week: Not on file    Minutes per session: Not on file  . Stress: Not on file  Relationships  . Social connections:    Talks on phone: Not on file    Gets together: Not on file    Attends religious service: Not on file    Active member of club or organization: Not on file    Attends meetings of clubs or organizations: Not on file    Relationship status: Not on file  . Intimate partner violence:    Fear of current or ex partner: Not on file  Emotionally abused: Not on file    Physically abused: Not on file    Forced sexual activity: Not on file  Other Topics Concern  . Not on file  Social History Narrative  . Not on file    Outpatient Medications Prior to Visit  Medication Sig Dispense Refill  . calcium citrate (CALCITRATE - DOSED IN MG ELEMENTAL CALCIUM) 950 MG tablet Take 200 mg of elemental calcium by mouth daily.    . Cholecalciferol (VITAMIN D) 2000 UNITS CAPS Take 3,000 Units by mouth daily.     Marland Kitchen KRILL OIL PO Take by mouth daily.    . nitrofurantoin, macrocrystal-monohydrate, (MACROBID) 100 MG capsule Take 1 capsule (100 mg total) by mouth 2 (two) times daily. 14 capsule 0   No facility-administered medications prior to visit.     No Known Allergies  Review of Systems  Constitutional: Negative for chills, fever and malaise/fatigue.  HENT: Negative for congestion and  hearing loss.   Eyes: Negative for discharge.  Respiratory: Negative for cough, sputum production and shortness of breath.   Cardiovascular: Negative for chest pain, palpitations and leg swelling.  Gastrointestinal: Negative for abdominal pain, blood in stool, constipation, diarrhea, heartburn, nausea and vomiting.  Genitourinary: Negative for dysuria, frequency, hematuria and urgency.  Musculoskeletal: Negative for back pain, falls and myalgias.  Skin: Negative for rash.  Neurological: Negative for dizziness, sensory change, loss of consciousness, weakness and headaches.  Endo/Heme/Allergies: Negative for environmental allergies. Does not bruise/bleed easily.  Psychiatric/Behavioral: Negative for depression and suicidal ideas. The patient is not nervous/anxious and does not have insomnia.        Objective:    Physical Exam Constitutional:      General: She is not in acute distress.    Appearance: She is not diaphoretic.  HENT:     Head: Normocephalic and atraumatic.     Right Ear: External ear normal.     Left Ear: External ear normal.     Nose: Nose normal.     Mouth/Throat:     Pharynx: No oropharyngeal exudate.  Eyes:     General: No scleral icterus.       Right eye: No discharge.        Left eye: No discharge.     Conjunctiva/sclera: Conjunctivae normal.     Pupils: Pupils are equal, round, and reactive to light.  Neck:     Musculoskeletal: Normal range of motion and neck supple.     Thyroid: No thyromegaly.  Cardiovascular:     Rate and Rhythm: Normal rate and regular rhythm.     Heart sounds: Normal heart sounds. No murmur.  Pulmonary:     Effort: Pulmonary effort is normal. No respiratory distress.     Breath sounds: Normal breath sounds. No wheezing or rales.  Abdominal:     General: Bowel sounds are normal. There is no distension.     Palpations: Abdomen is soft. There is no mass.     Tenderness: There is no abdominal tenderness.  Musculoskeletal: Normal range  of motion.        General: No tenderness.  Lymphadenopathy:     Cervical: No cervical adenopathy.  Skin:    General: Skin is warm and dry.     Findings: No rash.  Neurological:     Mental Status: She is alert and oriented to person, place, and time.     Cranial Nerves: No cranial nerve deficit.     Coordination: Coordination normal.     Deep  Tendon Reflexes: Reflexes are normal and symmetric. Reflexes normal.     BP 118/84 (BP Location: Left Arm, Patient Position: Sitting, Cuff Size: Normal)   Pulse 75   Temp 98.4 F (36.9 C) (Oral)   Resp 18   Ht 5\' 3"  (1.6 m)   Wt 107 lb 9.6 oz (48.8 kg)   SpO2 95%   BMI 19.06 kg/m  Wt Readings from Last 3 Encounters:  07/25/18 107 lb 9.6 oz (48.8 kg)  04/26/17 105 lb 6.4 oz (47.8 kg)  01/31/17 105 lb (47.6 kg)     Lab Results  Component Value Date   WBC 5.0 07/25/2018   HGB 14.7 07/25/2018   HCT 44.5 07/25/2018   PLT 284.0 07/25/2018   GLUCOSE 88 07/25/2018   CHOL 241 (H) 07/25/2018   TRIG 66.0 07/25/2018   HDL 88.20 07/25/2018   LDLCALC 140 (H) 07/25/2018   ALT 18 07/25/2018   AST 19 07/25/2018   NA 140 07/25/2018   K 4.9 07/25/2018   CL 103 07/25/2018   CREATININE 0.57 07/25/2018   BUN 11 07/25/2018   CO2 29 07/25/2018   TSH 2.56 07/25/2018   HGBA1C 5.5 01/24/2017    Lab Results  Component Value Date   TSH 2.56 07/25/2018   Lab Results  Component Value Date   WBC 5.0 07/25/2018   HGB 14.7 07/25/2018   HCT 44.5 07/25/2018   MCV 93.1 07/25/2018   PLT 284.0 07/25/2018   Lab Results  Component Value Date   NA 140 07/25/2018   K 4.9 07/25/2018   CO2 29 07/25/2018   GLUCOSE 88 07/25/2018   BUN 11 07/25/2018   CREATININE 0.57 07/25/2018   BILITOT 0.6 07/25/2018   ALKPHOS 66 07/25/2018   AST 19 07/25/2018   ALT 18 07/25/2018   PROT 7.1 07/25/2018   ALBUMIN 4.9 07/25/2018   CALCIUM 9.9 07/25/2018   GFR 108.11 07/25/2018   Lab Results  Component Value Date   CHOL 241 (H) 07/25/2018   Lab Results    Component Value Date   HDL 88.20 07/25/2018   Lab Results  Component Value Date   LDLCALC 140 (H) 07/25/2018   Lab Results  Component Value Date   TRIG 66.0 07/25/2018   Lab Results  Component Value Date   CHOLHDL 3 07/25/2018   Lab Results  Component Value Date   HGBA1C 5.5 01/24/2017       Assessment & Plan:   Problem List Items Addressed This Visit    Hyperlipidemia    Encouraged heart healthy diet, increase exercise, avoid trans fats, consider a krill oil cap daily      Relevant Orders   Comprehensive metabolic panel (Completed)   Lipid panel (Completed)   VITAMIN D 25 Hydroxy (Vit-D Deficiency, Fractures) (Completed)   Osteoporosis    Encouraged to get adequate exercise, calcium and vitamin d intake. Will repeat DEXA scan soon and consider Fosamax      Relevant Orders   DG Bone Density   Vitamin D deficiency    Supplement and monitor      Relevant Orders   VITAMIN D 25 Hydroxy (Vit-D Deficiency, Fractures) (Completed)   Preventative health care    Patient encouraged to maintain heart healthy diet, regular exercise, adequate sleep. Consider daily probiotics. Take medications as prescribed.       Relevant Orders   CBC (Completed)   TSH (Completed)    Other Visit Diagnoses    Breast cancer screening    -  Primary  Relevant Orders   MM 3D SCREEN BREAST BILATERAL      I have discontinued Corneshia Dinsmore's nitrofurantoin (macrocrystal-monohydrate). I am also having her maintain her Vitamin D, KRILL OIL PO, and calcium citrate.  No orders of the defined types were placed in this encounter.    Penni Homans, MD

## 2018-07-26 NOTE — Assessment & Plan Note (Signed)
Encouraged to get adequate exercise, calcium and vitamin d intake. Will repeat DEXA scan soon and consider Fosamax

## 2018-08-01 ENCOUNTER — Inpatient Hospital Stay (HOSPITAL_BASED_OUTPATIENT_CLINIC_OR_DEPARTMENT_OTHER): Admission: RE | Admit: 2018-08-01 | Payer: 59 | Source: Ambulatory Visit

## 2018-08-04 ENCOUNTER — Other Ambulatory Visit: Payer: Self-pay | Admitting: Family Medicine

## 2018-08-04 DIAGNOSIS — E785 Hyperlipidemia, unspecified: Secondary | ICD-10-CM

## 2018-08-15 ENCOUNTER — Other Ambulatory Visit (HOSPITAL_BASED_OUTPATIENT_CLINIC_OR_DEPARTMENT_OTHER): Payer: 59

## 2018-08-15 ENCOUNTER — Ambulatory Visit (HOSPITAL_BASED_OUTPATIENT_CLINIC_OR_DEPARTMENT_OTHER): Payer: 59

## 2018-08-22 ENCOUNTER — Ambulatory Visit (HOSPITAL_BASED_OUTPATIENT_CLINIC_OR_DEPARTMENT_OTHER)
Admission: RE | Admit: 2018-08-22 | Discharge: 2018-08-22 | Disposition: A | Discharge status: Home / Self Care | Payer: 59 | Source: Ambulatory Visit | Attending: Family Medicine | Admitting: Family Medicine

## 2018-08-22 DIAGNOSIS — Z1239 Encounter for other screening for malignant neoplasm of breast: Secondary | ICD-10-CM

## 2018-08-22 DIAGNOSIS — M810 Age-related osteoporosis without current pathological fracture: Secondary | ICD-10-CM | POA: Insufficient documentation

## 2019-03-23 DIAGNOSIS — Z23 Encounter for immunization: Secondary | ICD-10-CM | POA: Diagnosis not present

## 2019-05-07 DIAGNOSIS — Z20828 Contact with and (suspected) exposure to other viral communicable diseases: Secondary | ICD-10-CM | POA: Diagnosis not present

## 2019-07-18 IMAGING — MG DIGITAL SCREENING BILATERAL MAMMOGRAM WITH TOMO AND CAD
8 series · 8 of 24 positions shown · non-contrast
Comparison: Previous exam(s).

CLINICAL DATA: Screening.

EXAM:
DIGITAL SCREENING BILATERAL MAMMOGRAM WITH TOMO AND CAD

[L MLO synth-2D]
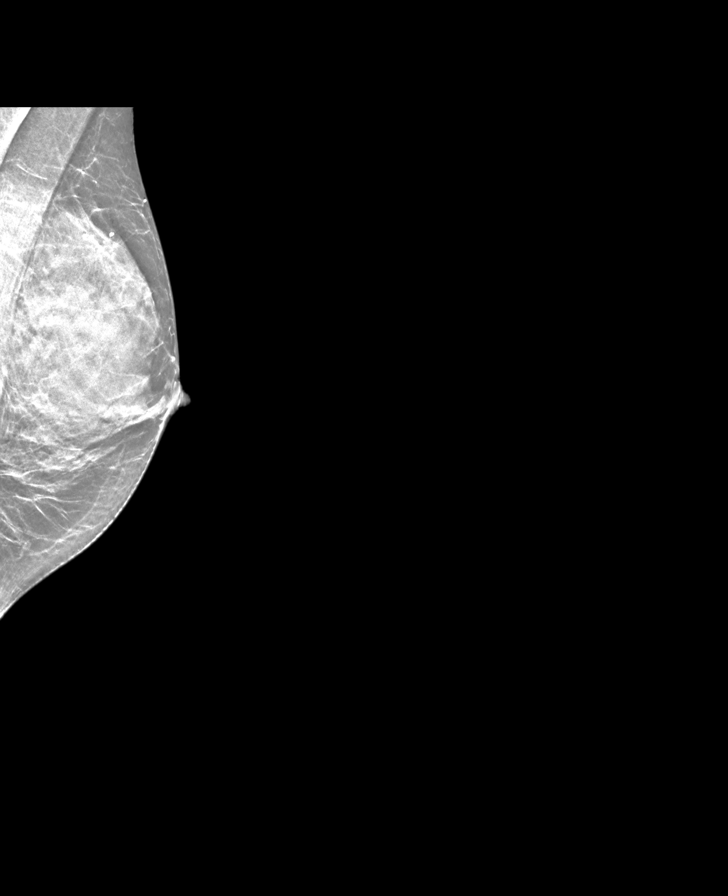

[R MLO synth-2D]
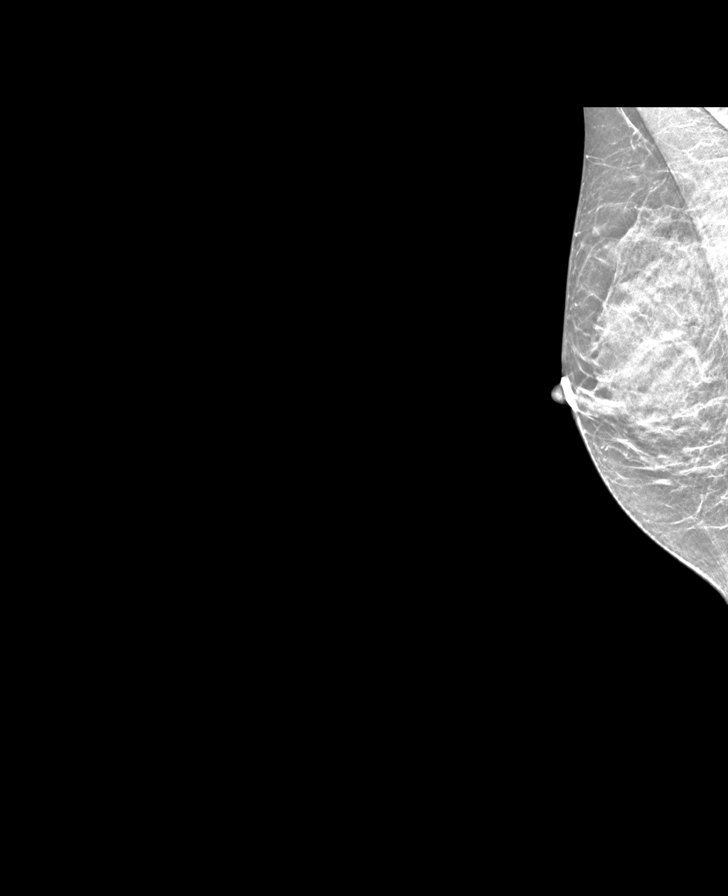

[R CC synth-2D]
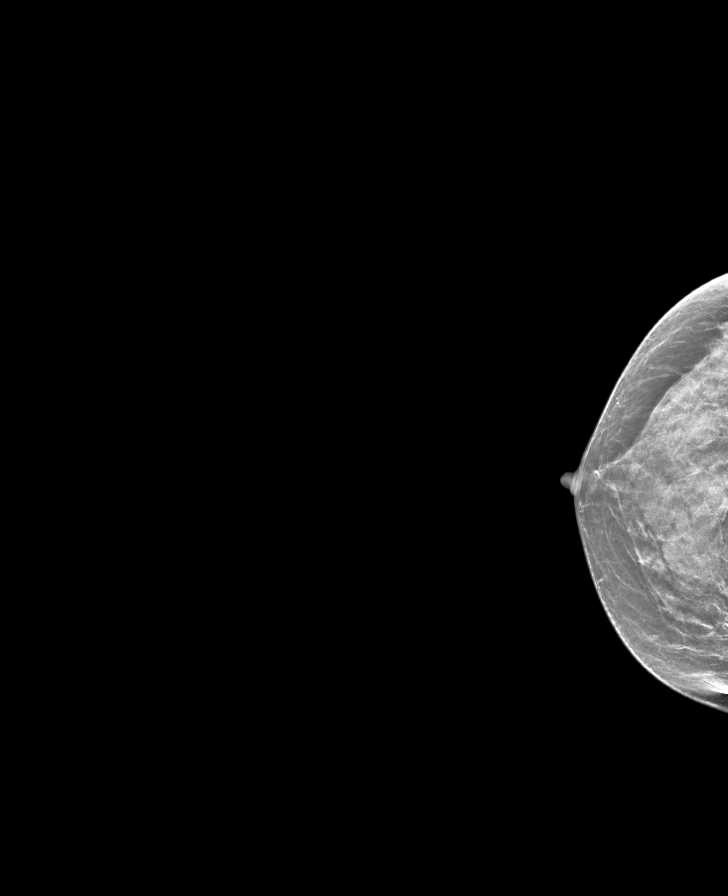

[L CC synth-2D]
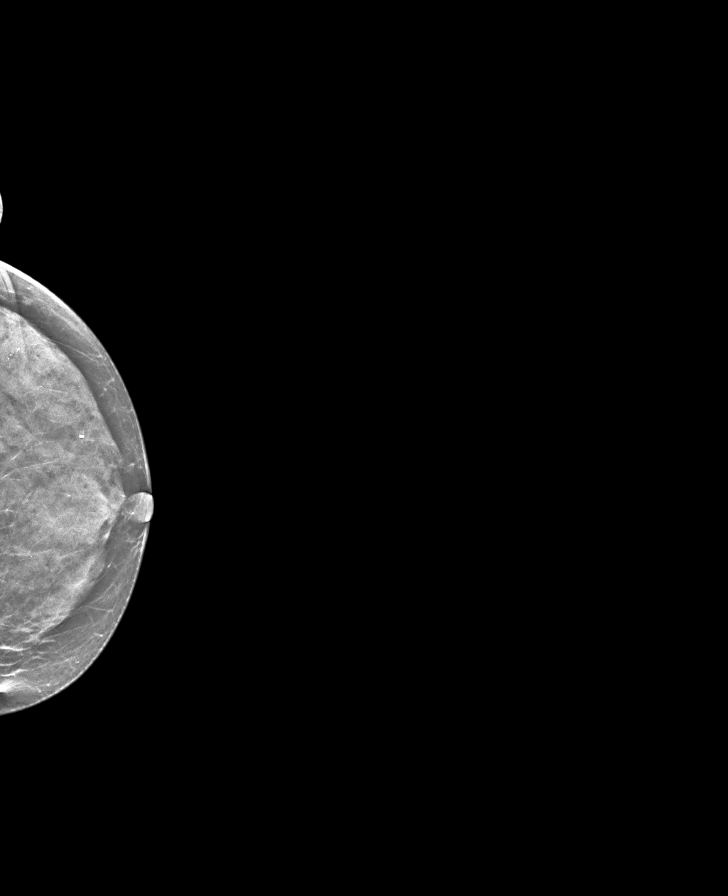

[L CC tomo · tomo slice 21/41.0]
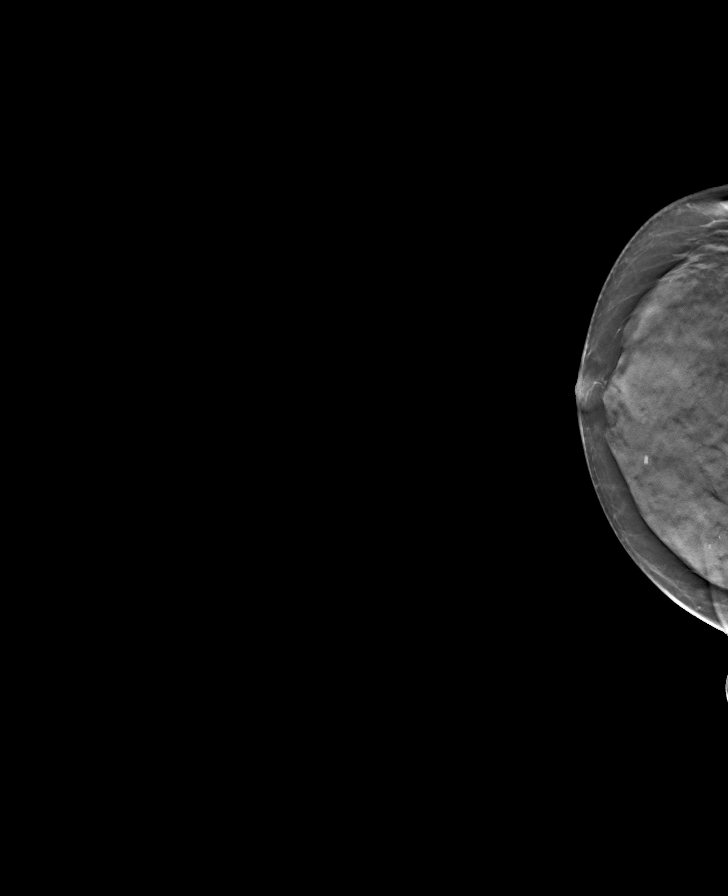

[R CC tomo · tomo slice 20/39.0]
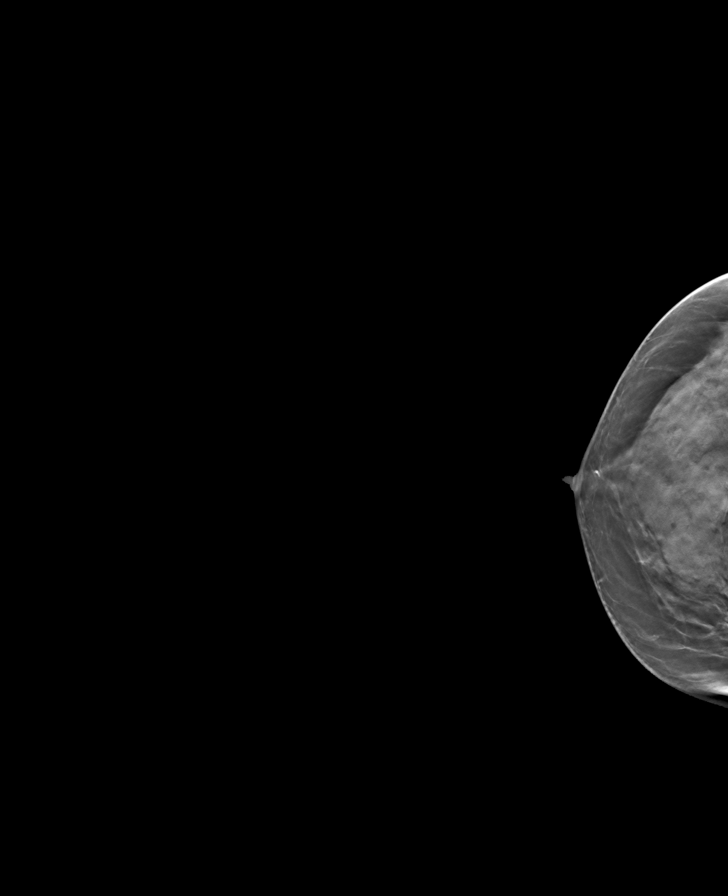

[L MLO tomo · tomo slice 21/40.0]
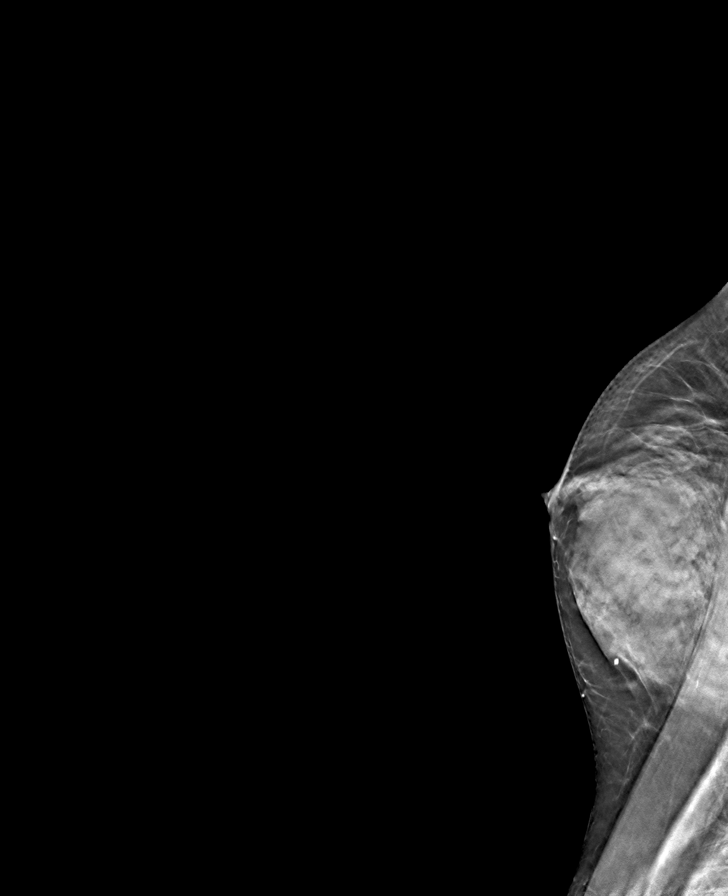

[R MLO tomo · tomo slice 18/35.0]
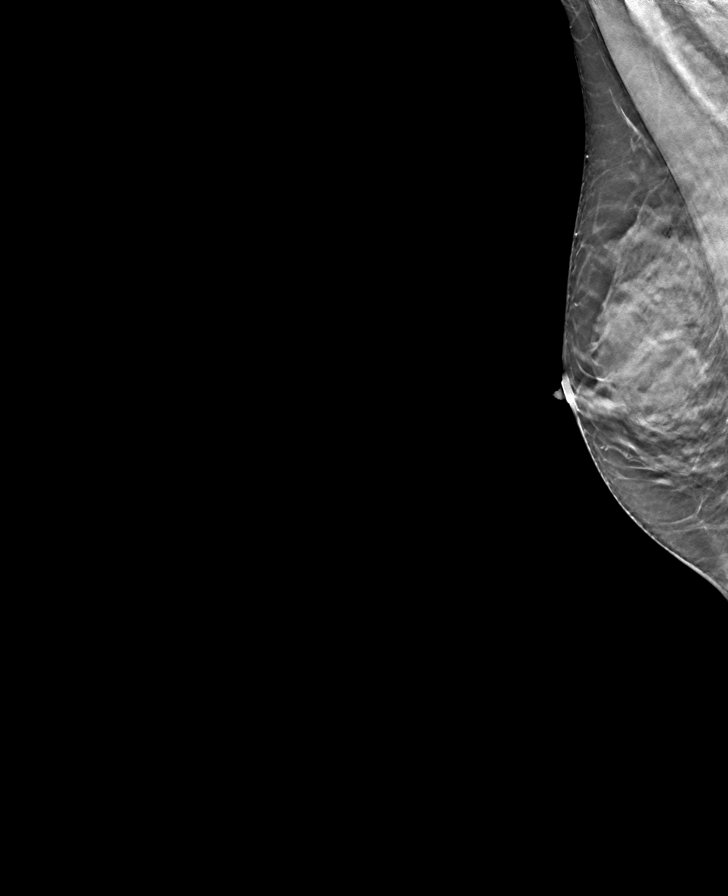

[8 of 24 positions shown; findings below may reference images not displayed]

ACR Breast Density Category d: The breast tissue is extremely dense,
which lowers the sensitivity of mammography
FINDINGS: There are no findings suspicious for malignancy. Images were
processed with CAD.
IMPRESSION: No mammographic evidence of malignancy. A result letter of this
screening mammogram will be mailed directly to the patient.

RECOMMENDATION:
Screening mammogram in one year. (Code:[id number]-[id number])

BI-RADS CATEGORY  1: Negative.

## 2019-07-31 ENCOUNTER — Encounter: Payer: 59 | Admitting: Family Medicine

## 2019-08-10 ENCOUNTER — Telehealth: Payer: Self-pay | Admitting: Family Medicine

## 2019-08-10 ENCOUNTER — Other Ambulatory Visit: Payer: Self-pay | Admitting: Family Medicine

## 2019-08-10 DIAGNOSIS — E559 Vitamin D deficiency, unspecified: Secondary | ICD-10-CM

## 2019-08-10 DIAGNOSIS — E785 Hyperlipidemia, unspecified: Secondary | ICD-10-CM

## 2019-08-10 DIAGNOSIS — Z0000 Encounter for general adult medical examination without abnormal findings: Secondary | ICD-10-CM

## 2019-08-10 NOTE — Telephone Encounter (Signed)
Patient is scheduled for a Cpe on 09/24/2019 and requesting to have labs drawn a week  prior to her appointment pls. She would like to discuss lab results with you on appointment date.

## 2019-08-10 NOTE — Telephone Encounter (Signed)
I have ordered her labs please arrange her a lab appt for the week prior to her 09/24/19 CPE

## 2019-08-11 NOTE — Telephone Encounter (Signed)
Lab appt scheduled 09/17/19 at 7:15am and mychart message sent to pt.

## 2019-09-17 ENCOUNTER — Other Ambulatory Visit: Payer: Self-pay

## 2019-09-17 ENCOUNTER — Other Ambulatory Visit (INDEPENDENT_AMBULATORY_CARE_PROVIDER_SITE_OTHER): Payer: 59

## 2019-09-17 DIAGNOSIS — E559 Vitamin D deficiency, unspecified: Secondary | ICD-10-CM | POA: Diagnosis not present

## 2019-09-17 DIAGNOSIS — Z0000 Encounter for general adult medical examination without abnormal findings: Secondary | ICD-10-CM | POA: Diagnosis not present

## 2019-09-17 DIAGNOSIS — R7989 Other specified abnormal findings of blood chemistry: Secondary | ICD-10-CM

## 2019-09-17 DIAGNOSIS — E785 Hyperlipidemia, unspecified: Secondary | ICD-10-CM | POA: Diagnosis not present

## 2019-09-17 LAB — VITAMIN D 25 HYDROXY (VIT D DEFICIENCY, FRACTURES): VITD: 35.51 ng/mL (ref 30.00–100.00)

## 2019-09-17 LAB — CBC
HCT: 40.6 % (ref 36.0–46.0)
Hemoglobin: 13.6 g/dL (ref 12.0–15.0)
MCHC: 33.4 g/dL (ref 30.0–36.0)
MCV: 92.7 fl (ref 78.0–100.0)
Platelets: 244 10*3/uL (ref 150.0–400.0)
RBC: 4.37 Mil/uL (ref 3.87–5.11)
RDW: 13.1 % (ref 11.5–15.5)
WBC: 4.2 10*3/uL (ref 4.0–10.5)

## 2019-09-17 LAB — COMPREHENSIVE METABOLIC PANEL
ALT: 18 U/L (ref 0–35)
AST: 21 U/L (ref 0–37)
Albumin: 4.5 g/dL (ref 3.5–5.2)
Alkaline Phosphatase: 73 U/L (ref 39–117)
BUN: 8 mg/dL (ref 6–23)
CO2: 29 mEq/L (ref 19–32)
Calcium: 9.1 mg/dL (ref 8.4–10.5)
Chloride: 106 mEq/L (ref 96–112)
Creatinine, Ser: 0.6 mg/dL (ref 0.40–1.20)
GFR: 101.51 mL/min (ref >60.00)
Glucose, Bld: 88 mg/dL (ref 70–99)
Potassium: 4.7 mEq/L (ref 3.5–5.1)
Sodium: 140 mEq/L (ref 135–145)
Total Bilirubin: 0.5 mg/dL (ref 0.2–1.2)
Total Protein: 6.5 g/dL (ref 6.0–8.3)

## 2019-09-17 LAB — T4, FREE: Free T4: 0.93 ng/dL (ref 0.60–1.60)

## 2019-09-17 LAB — TSH: TSH: 4.56 u[IU]/mL — ABNORMAL HIGH (ref 0.35–4.50)

## 2019-09-17 LAB — LIPID PANEL
Cholesterol: 211 mg/dL — ABNORMAL HIGH (ref 0–200)
HDL: 79.2 mg/dL (ref >39.00)
LDL Cholesterol: 121 mg/dL — ABNORMAL HIGH (ref 0–99)
NonHDL: 131.96
Total CHOL/HDL Ratio: 3
Triglycerides: 56 mg/dL (ref 0.0–149.0)
VLDL: 11.2 mg/dL (ref 0.0–40.0)

## 2019-09-17 NOTE — Addendum Note (Signed)
Addended by: Trenda Moots on: [id number] 04:03 PM   Modules accepted: Orders

## 2019-09-18 NOTE — Addendum Note (Signed)
Addended by: Magdalene Molly A on: 09/18/2019 11:21 AM   Modules accepted: Orders

## 2019-09-24 ENCOUNTER — Encounter: Payer: Self-pay | Admitting: Family Medicine

## 2019-09-24 ENCOUNTER — Ambulatory Visit (HOSPITAL_BASED_OUTPATIENT_CLINIC_OR_DEPARTMENT_OTHER)
Admission: RE | Admit: 2019-09-24 | Discharge: 2019-09-24 | Disposition: A | Discharge status: Home / Self Care | Payer: 59 | Source: Ambulatory Visit | Attending: Family Medicine | Admitting: Family Medicine

## 2019-09-24 ENCOUNTER — Ambulatory Visit (INDEPENDENT_AMBULATORY_CARE_PROVIDER_SITE_OTHER): Payer: 59 | Admitting: Family Medicine

## 2019-09-24 ENCOUNTER — Other Ambulatory Visit: Payer: Self-pay

## 2019-09-24 VITALS — BP 133/55 | HR 79 | Temp 98.0°F | Resp 12 | Ht 63.0 in | Wt 112.6 lb

## 2019-09-24 DIAGNOSIS — E559 Vitamin D deficiency, unspecified: Secondary | ICD-10-CM | POA: Diagnosis not present

## 2019-09-24 DIAGNOSIS — Z0000 Encounter for general adult medical examination without abnormal findings: Secondary | ICD-10-CM | POA: Diagnosis not present

## 2019-09-24 DIAGNOSIS — Z1239 Encounter for other screening for malignant neoplasm of breast: Secondary | ICD-10-CM | POA: Diagnosis not present

## 2019-09-24 DIAGNOSIS — Z1231 Encounter for screening mammogram for malignant neoplasm of breast: Secondary | ICD-10-CM | POA: Insufficient documentation

## 2019-09-24 DIAGNOSIS — E785 Hyperlipidemia, unspecified: Secondary | ICD-10-CM | POA: Diagnosis not present

## 2019-09-24 DIAGNOSIS — R7989 Other specified abnormal findings of blood chemistry: Secondary | ICD-10-CM

## 2019-09-24 NOTE — Patient Instructions (Addendum)
Consider Prolia if do not tolerate the Fosamax  Omron Blood Pressure cuff, upper arm, want BP 100-140/60-90 Pulse oximeter, want oxygen in 90s  Weekly vitals  Take Multivitamin with minerals, selenium Vitamin D 1000-2000 IU daily Probiotic with lactobacillus and bifidophilus Asprin EC 81 mg daily  Melatonin 2-5 mg at bedtime  [email or web address]   Benefiber once to twice a day, Imodium prn, probiotic, [employer]   Preventive Care 17-70 Years Old, Female Preventive care refers to visits with your health care provider and lifestyle choices that can promote health and wellness. This includes:  A yearly physical exam. This may also be called an annual well check.  Regular dental visits and eye exams.  Immunizations.  Screening for certain conditions.  Healthy lifestyle choices, such as eating a healthy diet, getting regular exercise, not using drugs or products that contain nicotine and tobacco, and limiting alcohol use. What can I expect for my preventive care visit? Physical exam Your health care provider will check your:  Height and weight. This may be used to calculate body mass index (BMI), which tells if you are at a healthy weight.  Heart rate and blood pressure.  Skin for abnormal spots. Counseling Your health care provider may ask you questions about your:  Alcohol, tobacco, and drug use.  Emotional well-being.  Home and relationship well-being.  Sexual activity.  Eating habits.  Work and work Statistician.  Method of birth control.  Menstrual cycle.  Pregnancy history. What immunizations do I need?  Influenza (flu) vaccine  This is recommended every year. Tetanus, diphtheria, and pertussis (Tdap) vaccine  You may need a Td booster every 10 years. Varicella (chickenpox) vaccine  You may need this if you have not been vaccinated. Zoster (shingles) vaccine  You may need this after age 44. Measles, mumps,  and rubella (MMR) vaccine  You may need at least one dose of MMR if you were born in 1957 or later. You may also need a second dose. Pneumococcal conjugate (PCV13) vaccine  You may need this if you have certain conditions and were not previously vaccinated. Pneumococcal polysaccharide (PPSV23) vaccine  You may need one or two doses if you smoke cigarettes or if you have certain conditions. Meningococcal conjugate (MenACWY) vaccine  You may need this if you have certain conditions. Hepatitis A vaccine  You may need this if you have certain conditions or if you travel or work in places where you may be exposed to hepatitis A. Hepatitis B vaccine  You may need this if you have certain conditions or if you travel or work in places where you may be exposed to hepatitis B. Haemophilus influenzae type b (Hib) vaccine  You may need this if you have certain conditions. Human papillomavirus (HPV) vaccine  If recommended by your health care provider, you may need three doses over 6 months. You may receive vaccines as individual doses or as more than one vaccine together in one shot (combination vaccines). Talk with your health care provider about the risks and benefits of combination vaccines. What tests do I need? Blood tests  Lipid and cholesterol levels. These may be checked every 5 years, or more frequently if you are over 23 years old.  Hepatitis C test.  Hepatitis B test. Screening  Lung cancer screening. You may have this screening every year starting at age 34 if you have a 30-pack-year history of smoking and currently smoke or have quit within the past 15 years.  Colorectal cancer screening. All  adults should have this screening starting at age 38 and continuing until age 72. Your health care provider may recommend screening at age 74 if you are at increased risk. You will have tests every 1-10 years, depending on your results and the type of screening test.  Diabetes screening.  This is done by checking your blood sugar (glucose) after you have not eaten for a while (fasting). You may have this done every 1-3 years.  Mammogram. This may be done every 1-2 years. Talk with your health care provider about when you should start having regular mammograms. This may depend on whether you have a family history of breast cancer.  BRCA-related cancer screening. This may be done if you have a family history of breast, ovarian, tubal, or peritoneal cancers.  Pelvic exam and Pap test. This may be done every 3 years starting at age 69. Starting at age 52, this may be done every 5 years if you have a Pap test in combination with an HPV test. Other tests  Sexually transmitted disease (STD) testing.  Bone density scan. This is done to screen for osteoporosis. You may have this scan if you are at high risk for osteoporosis. Follow these instructions at home: Eating and drinking  Eat a diet that includes fresh fruits and vegetables, whole grains, lean protein, and low-fat dairy.  Take vitamin and mineral supplements as recommended by your health care provider.  Do not drink alcohol if: ? Your health care provider tells you not to drink. ? You are pregnant, may be pregnant, or are planning to become pregnant.  If you drink alcohol: ? Limit how much you have to 0-1 drink a day. ? Be aware of how much alcohol is in your drink. In the [place] one drink equals one 12 oz bottle of beer (355 mL), one 5 oz glass of wine (148 mL), or one 1 oz glass of hard liquor (44 mL). Lifestyle  Take daily care of your teeth and gums.  Stay active. Exercise for at least 30 minutes on 5 or more days each week.  Do not use any products that contain nicotine or tobacco, such as cigarettes, e-cigarettes, and chewing tobacco. If you need help quitting, ask your health care provider.  If you are sexually active, practice safe sex. Use a condom or other form of birth control (contraception) in order to  prevent pregnancy and STIs (sexually transmitted infections).  If told by your health care provider, take low-dose aspirin daily starting at age 56. What's next?  Visit your health care provider once a year for a well check visit.  Ask your health care provider how often you should have your eyes and teeth checked.  Stay up to date on all vaccines. This information is not intended to replace advice given to you by your health care provider. Make sure you discuss any questions you have with your health care provider. Document Revised: 02/20/2018 Document Reviewed: 02/20/2018 Elsevier Patient Education  2020 [employer].

## 2019-09-24 NOTE — Assessment & Plan Note (Signed)
Encouraged heart healthy diet, increase exercise, avoid trans fats, consider a krill oil cap daily 

## 2019-09-24 NOTE — Assessment & Plan Note (Signed)
Supplement and monitor 

## 2019-09-24 NOTE — Assessment & Plan Note (Addendum)
Patient encouraged to maintain heart healthy diet, regular exercise, adequate sleep. Consider daily probiotics. Take medications as prescribed. Labs ordered and reviewed, mammogram ordered. Pap at next visit.

## 2019-09-28 DIAGNOSIS — R7989 Other specified abnormal findings of blood chemistry: Secondary | ICD-10-CM | POA: Insufficient documentation

## 2019-09-28 NOTE — Progress Notes (Signed)
Subjective:    Patient ID: Catherine Haynes, female    DOB: 12-21-1955, 64 y.o.   MRN: [id number]  Chief Complaint  Patient presents with  . Annual Exam    HPI Patient is in today for annual preventative exam and follow up on chronic medical concerns. No recent febrile illness or hospitalizations. Has been trying to maintain quarantine during the pandemic. Is trying to eat well and stay active on a good day. Denies CP/palp/SOB/HA/congestion/fevers/GI or GU c/o. Taking meds as prescribed. No polyuria or polydipsia.   Past Medical History:  Diagnosis Date  .  social 12/29/2014  . Abdominal aortic ectasia ([place]) 12/11/2014  . Allergy   . Atrial fibrillation ([place]) 12/11/2014  . BCC (basal cell carcinoma), face 12/11/2014  . Cervical cancer screening 12/05/2015  . History of shingles 01/24/2017  . Hx: UTI (urinary tract infection) 12/11/2014  . Hyperlipidemia   . Mitral valve prolapse   . Morbid obesity ([place]) 12/11/2014  . Osteoporosis 12/11/2014  . Paroxysmal atrial fibrillation ([place]) 12/11/2014  . Preventative health care 12/05/2015  . Vitamin D deficiency 12/29/2014    Past Surgical History:  Procedure Laterality Date  . CESAREAN SECTION    . TONSILLECTOMY      Family History  Problem Relation Age of Onset  . Stroke Mother        TIA  . Hypertension Mother   . Hyperlipidemia Mother   . Diabetes Mother        diet controlled  . Osteoporosis Mother   . Stroke Father   . Hypertension Father   . Diabetes Father   . Hyperlipidemia Father   . Mental illness Sister 18       suicide  . Heart disease Maternal Grandmother        chf  . Osteoporosis Maternal Grandmother   . Alcohol abuse Maternal Grandfather   . Cancer Paternal Grandmother   . Diabetes Paternal Grandmother   . Heart disease Paternal Grandfather     Social History   Socioeconomic History  . Marital status: Married    Spouse name: Not on file  . Number of children: Not on file  . Years of education: Not on file    . Highest education level: Not on file  Occupational History  . Occupation: clean houses   Tobacco Use  . Smoking status: Never Smoker  . Smokeless tobacco: Never Used  Substance and Sexual Activity  . Alcohol use: Yes    Alcohol/week: 0.0 standard drinks    Comment: social.  . Drug use: No  . 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 Not on file  Social History Narrative  . Not on file   Social Determinants of Health   Financial Resource Strain:   . Difficulty of Paying Living Expenses:   Food Insecurity:   . Worried About Charity fundraiser in the Last Year:   . Arboriculturist in the Last Year:   Transportation Needs:   . Film/video editor (Medical):   Marland Kitchen Lack of Transportation (Non-Medical):   Physical Activity:   . Days of Exercise per Week:   . Minutes of Exercise per Session:   Stress:   . Feeling of Stress :   Social Connections:   . Frequency of Communication with Friends and Family:   . Frequency of Social Gatherings with Friends and Family:   . Attends Religious  Services:   . Active Member of Clubs or Organizations:   . Attends Archivist Meetings:   Marland Kitchen Marital Status:   Intimate Partner Violence:   . Fear of Current or Ex-Partner:   . Emotionally Abused:   Marland Kitchen Physically Abused:   . Sexually Abused:     Outpatient Medications Prior to Visit  Medication Sig Dispense Refill  . Biotin 5000 MCG TABS Take 1 tablet by mouth daily.    . Calcium Carbonate (CALCIUM 600 PO) Take 1 tablet by mouth daily.    . Cholecalciferol (VITAMIN D) 50 MCG (2000 UT) tablet Take 2,000 Units by mouth daily.    Marland Kitchen FEXOFENADINE HCL PO Take by mouth.    . Omega-3 Fatty Acids (FISH OIL) 1000 MG CPDR Take 1 capsule by mouth daily.    . calcium citrate (CALCITRATE - DOSED IN MG ELEMENTAL CALCIUM) 950 MG tablet Take 200 mg of elemental calcium by mouth daily.    . Cholecalciferol  (VITAMIN D) 2000 UNITS CAPS Take 3,000 Units by mouth daily.     Marland Kitchen KRILL OIL PO Take by mouth daily.     No facility-administered medications prior to visit.    No Known Allergies  Review of Systems  Constitutional: Negative for chills, fever and malaise/fatigue.  HENT: Negative for congestion and hearing loss.   Eyes: Negative for discharge.  Respiratory: Negative for cough, sputum production and shortness of breath.   Cardiovascular: Negative for chest pain, palpitations and leg swelling.  Gastrointestinal: Negative for abdominal pain, blood in stool, constipation, diarrhea, heartburn, nausea and vomiting.  Genitourinary: Negative for dysuria, frequency, hematuria and urgency.  Musculoskeletal: Negative for back pain, falls and myalgias.  Skin: Negative for rash.  Neurological: Negative for dizziness, sensory change, loss of consciousness, weakness and headaches.  Endo/Heme/Allergies: Negative for environmental allergies. Does not bruise/bleed easily.  Psychiatric/Behavioral: Negative for depression and suicidal ideas. The patient is not nervous/anxious and does not have insomnia.        Objective:    Physical Exam Constitutional:      General: She is not in acute distress.    Appearance: She is not diaphoretic.  HENT:     Head: Normocephalic and atraumatic.     Right Ear: External ear normal.     Left Ear: External ear normal.     Nose: Nose normal.     Mouth/Throat:     Pharynx: No oropharyngeal exudate.  Eyes:     General: No scleral icterus.       Right eye: No discharge.        Left eye: No discharge.     Conjunctiva/sclera: Conjunctivae normal.     Pupils: Pupils are equal, round, and reactive to light.  Neck:     Thyroid: No thyromegaly.  Cardiovascular:     Rate and Rhythm: Normal rate and regular rhythm.     Heart sounds: Normal heart sounds. No murmur.  Pulmonary:     Effort: Pulmonary effort is normal. No respiratory distress.     Breath sounds: Normal  breath sounds. No wheezing or rales.  Abdominal:     General: Bowel sounds are normal. There is no distension.     Palpations: Abdomen is soft. There is no mass.     Tenderness: There is no abdominal tenderness.  Musculoskeletal:        General: No tenderness. Normal range of motion.     Cervical back: Normal range of motion and neck supple.  Lymphadenopathy:  Cervical: No cervical adenopathy.  Skin:    General: Skin is warm and dry.     Findings: No rash.  Neurological:     Mental Status: She is alert and oriented to person, place, and time.     Cranial Nerves: No cranial nerve deficit.     Coordination: Coordination normal.     Deep Tendon Reflexes: Reflexes are normal and symmetric. Reflexes normal.     BP (!) 133/55 (BP Location: Left Arm, Cuff Size: Normal)   Pulse 79   Temp 98 F (36.7 C) (Temporal)   Resp 12   Ht 5\' 3"  (1.6 m)   Wt 112 lb 9.6 oz (51.1 kg)   SpO2 100%   BMI 19.95 kg/m  Wt Readings from Last 3 Encounters:  09/24/19 112 lb 9.6 oz (51.1 kg)  07/25/18 107 lb 9.6 oz (48.8 kg)  04/26/17 105 lb 6.4 oz (47.8 kg)    Diabetic Foot Exam - Simple   No data filed     Lab Results  Component Value Date   WBC 4.2 09/17/2019   HGB 13.6 09/17/2019   HCT 40.6 09/17/2019   PLT 244.0 09/17/2019   GLUCOSE 88 09/17/2019   CHOL 211 (H) 09/17/2019   TRIG 56.0 09/17/2019   HDL 79.20 09/17/2019   LDLCALC 121 (H) 09/17/2019   ALT 18 09/17/2019   AST 21 09/17/2019   NA 140 09/17/2019   K 4.7 09/17/2019   CL 106 09/17/2019   CREATININE 0.60 09/17/2019   BUN 8 09/17/2019   CO2 29 09/17/2019   TSH 4.56 (H) 09/17/2019   HGBA1C 5.5 01/24/2017    Lab Results  Component Value Date   TSH 4.56 (H) 09/17/2019   Lab Results  Component Value Date   WBC 4.2 09/17/2019   HGB 13.6 09/17/2019   HCT 40.6 09/17/2019   MCV 92.7 09/17/2019   PLT 244.0 09/17/2019   Lab Results  Component Value Date   NA 140 09/17/2019   K 4.7 09/17/2019   CO2 29 09/17/2019    GLUCOSE 88 09/17/2019   BUN 8 09/17/2019   CREATININE 0.60 09/17/2019   BILITOT 0.5 09/17/2019   ALKPHOS 73 09/17/2019   AST 21 09/17/2019   ALT 18 09/17/2019   PROT 6.5 09/17/2019   ALBUMIN 4.5 09/17/2019   CALCIUM 9.1 09/17/2019   GFR 101.51 09/17/2019   Lab Results  Component Value Date   CHOL 211 (H) 09/17/2019   Lab Results  Component Value Date   HDL 79.20 09/17/2019   Lab Results  Component Value Date   LDLCALC 121 (H) 09/17/2019   Lab Results  Component Value Date   TRIG 56.0 09/17/2019   Lab Results  Component Value Date   CHOLHDL 3 09/17/2019   Lab Results  Component Value Date   HGBA1C 5.5 01/24/2017       Assessment & Plan:   Problem List Items Addressed This Visit    Hyperlipidemia    Encouraged heart healthy diet, increase exercise, avoid trans fats, consider a krill oil cap daily      Relevant Orders   Comprehensive metabolic panel   Lipid panel   Vitamin D deficiency    Supplement and monitor      Relevant Orders   VITAMIN D 25 Hydroxy (Vit-D Deficiency, Fractures)   Preventative health care    Patient encouraged to maintain heart healthy diet, regular exercise, adequate sleep. Consider daily probiotics. Take medications as prescribed. Labs ordered and reviewed, mammogram ordered. Pap  at next visit.       Abnormal TSH    Continue to monitor, free T4 is normal      Relevant Orders   TSH   T4, free    Other Visit Diagnoses    Encounter for screening for malignant neoplasm of breast, unspecified screening modality    -  Primary   Relevant Orders   MM 3D SCREEN BREAST BILATERAL (Completed)      I have discontinued Galen Breden's KRILL OIL PO and calcium citrate. I am also having her maintain her Calcium Carbonate (CALCIUM 600 PO), Vitamin D, Fish Oil, Biotin, and FEXOFENADINE HCL PO.  No orders of the defined types were placed in this encounter.    Penni Homans, MD

## 2019-09-28 NOTE — Assessment & Plan Note (Addendum)
Continue to monitor, free T4 is normal

## 2019-10-21 ENCOUNTER — Other Ambulatory Visit: Payer: Self-pay | Admitting: Family Medicine

## 2019-10-21 ENCOUNTER — Telehealth: Payer: Self-pay

## 2019-10-21 MED ORDER — NITROFURANTOIN MONOHYD MACRO 100 MG PO CAPS: 100.0000 mg | ORAL_CAPSULE | Freq: Two times a day (BID) | ORAL | 0 refills | Status: DC

## 2019-10-21 NOTE — Telephone Encounter (Signed)
She just saw you yesterday.  Do you want her bring in urine for lab only or would you like for me to get her set up with another provider?

## 2019-10-21 NOTE — Telephone Encounter (Signed)
Left message on machine to call back  

## 2019-10-21 NOTE — Telephone Encounter (Signed)
I have sent in a prescription for Macrobid but she should come in and leave a urine sample if possible to check sensitivities then start the macrobid if she feels she has to start it right away then have her come in after she finishes the course so we can check for resolution.

## 2019-10-21 NOTE — Telephone Encounter (Signed)
Patient called in to see If Dr. Charlett Blake could call in a prescription for a UTI please advise and follow up with the patient at [phone]

## 2019-10-22 ENCOUNTER — Other Ambulatory Visit: Payer: Self-pay

## 2019-10-22 ENCOUNTER — Encounter: Payer: Self-pay | Admitting: *Deleted

## 2019-10-22 ENCOUNTER — Telehealth: Payer: 59 | Admitting: Family Medicine

## 2019-10-22 DIAGNOSIS — R300 Dysuria: Secondary | ICD-10-CM

## 2019-10-22 NOTE — Progress Notes (Deleted)
[facility] [address] [place], [place] [address] [phone]  Date:  10/22/2019   Name:  Catherine Haynes   DOB:  [birth year]   MRN:  [id number]  PCP:  Mosie Lukes, MD    Chief Complaint: No chief complaint on file.   History of Present Illness:  Catherine Haynes is a 64 y.o. very pleasant female patient who presents with the following:  Patient of my partner Dr. Charlett Blake presents for a video visit today for concern of urinary problem Patient location is home, provider location is office Patient identity confirmed with 2 factors, she gives consent for virtual visit today I have not seen this patient previously  She has history of basal cell carcinoma, osteoporosis/vitamin D deficiency, hyperlipidemia Most recent urine culture on chart from November 2018  Patient Active Problem List   Diagnosis Date Noted  . Abnormal TSH 09/28/2019  . Lower back injury, initial encounter 02/01/2017  . History of shingles 01/24/2017  . Cervical cancer screening 12/05/2015  . Preventative health care 12/05/2015  . Vitamin D deficiency 12/29/2014  . BCC (basal cell carcinoma), face 12/11/2014  . Hx: UTI (urinary tract infection) 12/11/2014  . Osteoporosis 12/11/2014  . Colon cancer screening 12/11/2014  . Hyperlipidemia   . Allergy   . Mitral valve prolapse     Past Medical History:  Diagnosis Date  .  social 12/29/2014  . Abdominal aortic ectasia ([place]) 12/11/2014  . Allergy   . Atrial fibrillation ([place]) 12/11/2014  . BCC (basal cell carcinoma), face 12/11/2014  . Cervical cancer screening 12/05/2015  . History of shingles 01/24/2017  . Hx: UTI (urinary tract infection) 12/11/2014  . Hyperlipidemia   . Mitral valve prolapse   . Morbid obesity ([place]) 12/11/2014  . Osteoporosis 12/11/2014  . Paroxysmal atrial fibrillation ([place]) 12/11/2014  . Preventative health care 12/05/2015  . Vitamin D deficiency 12/29/2014    Past Surgical  History:  Procedure Laterality Date  . CESAREAN SECTION    . TONSILLECTOMY      Social History   Tobacco Use  . Smoking status: Never Smoker  . Smokeless tobacco: Never Used  Substance Use Topics  . Alcohol use: Yes    Alcohol/week: 0.0 standard drinks    Comment: social.  . Drug use: No    Family History  Problem Relation Age of Onset  . Stroke Mother        TIA  . Hypertension Mother   . Hyperlipidemia Mother   . Diabetes Mother        diet controlled  . Osteoporosis Mother   . Stroke Father   . Hypertension Father   . Diabetes Father   . Hyperlipidemia Father   . Mental illness Sister 60       suicide  . Heart disease Maternal Grandmother        chf  . Osteoporosis Maternal Grandmother   . Alcohol abuse Maternal Grandfather   . Cancer Paternal Grandmother   . Diabetes Paternal Grandmother   . Heart disease Paternal Grandfather     No Known Allergies  Medication list has been reviewed and updated.  Current Outpatient Medications on File Prior to Visit  Medication Sig Dispense Refill  . Biotin 5000 MCG TABS Take 1 tablet by mouth daily.    . Calcium Carbonate (CALCIUM 600 PO) Take 1 tablet by mouth daily.    . Cholecalciferol (VITAMIN D) 50 MCG (2000 UT) tablet Take  2,000 Units by mouth daily.    Marland Kitchen FEXOFENADINE HCL PO Take by mouth.    . nitrofurantoin, macrocrystal-monohydrate, (MACROBID) 100 MG capsule Take 1 capsule (100 mg total) by mouth 2 (two) times daily. 6 capsule 0  . Omega-3 Fatty Acids (FISH OIL) 1000 MG CPDR Take 1 capsule by mouth daily.     No current facility-administered medications on file prior to visit.    Review of Systems:  As per HPI- otherwise negative.   Physical Examination: There were no vitals filed for this visit. There were no vitals filed for this visit. There is no height or weight on file to calculate BMI. Ideal Body Weight:    ***  Assessment and Plan: ***  Signed Lamar Blinks, MD

## 2019-10-22 NOTE — Telephone Encounter (Signed)
No answer this morning, went straight to vm.  I sent a detailed mychart message to patient.

## 2019-10-27 ENCOUNTER — Telehealth: Payer: Self-pay | Admitting: Family Medicine

## 2019-10-27 NOTE — Telephone Encounter (Signed)
Patient called in to make a lab appointment per [facility] request for urine sample.  I do not see an order   Please advise

## 2019-10-28 NOTE — Telephone Encounter (Signed)
Orders already in.

## 2019-10-29 ENCOUNTER — Other Ambulatory Visit (INDEPENDENT_AMBULATORY_CARE_PROVIDER_SITE_OTHER): Payer: 59

## 2019-10-29 ENCOUNTER — Other Ambulatory Visit: Payer: Self-pay

## 2019-10-29 DIAGNOSIS — R300 Dysuria: Secondary | ICD-10-CM | POA: Diagnosis not present

## 2019-10-29 LAB — POC URINALSYSI DIPSTICK (AUTOMATED)
Bilirubin, UA: NEGATIVE
Blood, UA: NEGATIVE
Glucose, UA: NEGATIVE
Ketones, UA: NEGATIVE
Leukocytes, UA: NEGATIVE
Nitrite, UA: NEGATIVE
Protein, UA: NEGATIVE
Spec Grav, UA: 1.015 (ref 1.010–1.025)
Urobilinogen, UA: 0.2 E.U./dL
pH, UA: 7.5 (ref 5.0–8.0)

## 2019-10-29 NOTE — Progress Notes (Signed)
yellowcle

## 2019-10-30 LAB — URINE CULTURE
MICRO NUMBER:: 10447453
Result:: NO GROWTH
SPECIMEN QUALITY:: ADEQUATE

## 2019-12-24 ENCOUNTER — Other Ambulatory Visit (INDEPENDENT_AMBULATORY_CARE_PROVIDER_SITE_OTHER): Payer: 59

## 2019-12-24 ENCOUNTER — Other Ambulatory Visit: Payer: Self-pay

## 2019-12-24 DIAGNOSIS — E559 Vitamin D deficiency, unspecified: Secondary | ICD-10-CM

## 2019-12-24 DIAGNOSIS — R7989 Other specified abnormal findings of blood chemistry: Secondary | ICD-10-CM

## 2019-12-24 DIAGNOSIS — E785 Hyperlipidemia, unspecified: Secondary | ICD-10-CM | POA: Diagnosis not present

## 2019-12-24 LAB — LIPID PANEL
Cholesterol: 219 mg/dL — ABNORMAL HIGH (ref 0–200)
HDL: 84.6 mg/dL (ref >39.00)
LDL Cholesterol: 123 mg/dL — ABNORMAL HIGH (ref 0–99)
NonHDL: 134.52
Total CHOL/HDL Ratio: 3
Triglycerides: 60 mg/dL (ref 0.0–149.0)
VLDL: 12 mg/dL (ref 0.0–40.0)

## 2019-12-24 LAB — COMPREHENSIVE METABOLIC PANEL
ALT: 16 U/L (ref 0–35)
AST: 19 U/L (ref 0–37)
Albumin: 4.4 g/dL (ref 3.5–5.2)
Alkaline Phosphatase: 71 U/L (ref 39–117)
BUN: 13 mg/dL (ref 6–23)
CO2: 28 mEq/L (ref 19–32)
Calcium: 9.4 mg/dL (ref 8.4–10.5)
Chloride: 105 mEq/L (ref 96–112)
Creatinine, Ser: 0.64 mg/dL (ref 0.40–1.20)
GFR: 94.14 mL/min (ref >60.00)
Glucose, Bld: 84 mg/dL (ref 70–99)
Potassium: 5 mEq/L (ref 3.5–5.1)
Sodium: 140 mEq/L (ref 135–145)
Total Bilirubin: 0.4 mg/dL (ref 0.2–1.2)
Total Protein: 6.4 g/dL (ref 6.0–8.3)

## 2019-12-24 LAB — VITAMIN D 25 HYDROXY (VIT D DEFICIENCY, FRACTURES): VITD: 39.34 ng/mL (ref 30.00–100.00)

## 2019-12-24 LAB — T4, FREE: Free T4: 0.84 ng/dL (ref 0.60–1.60)

## 2019-12-24 LAB — TSH: TSH: 3.12 u[IU]/mL (ref 0.35–4.50)

## 2020-09-19 ENCOUNTER — Other Ambulatory Visit (HOSPITAL_BASED_OUTPATIENT_CLINIC_OR_DEPARTMENT_OTHER): Payer: Self-pay | Admitting: Family Medicine

## 2020-09-19 DIAGNOSIS — Z1231 Encounter for screening mammogram for malignant neoplasm of breast: Secondary | ICD-10-CM

## 2020-09-26 ENCOUNTER — Encounter: Payer: Self-pay | Admitting: Family Medicine

## 2020-09-26 ENCOUNTER — Other Ambulatory Visit: Payer: Self-pay

## 2020-09-26 ENCOUNTER — Ambulatory Visit (INDEPENDENT_AMBULATORY_CARE_PROVIDER_SITE_OTHER): Payer: Managed Care, Other (non HMO) | Admitting: Family Medicine

## 2020-09-26 VITALS — BP 130/76 | HR 71 | Temp 97.8°F | Resp 16 | Ht 63.0 in | Wt 114.0 lb

## 2020-09-26 DIAGNOSIS — E785 Hyperlipidemia, unspecified: Secondary | ICD-10-CM

## 2020-09-26 DIAGNOSIS — R7989 Other specified abnormal findings of blood chemistry: Secondary | ICD-10-CM

## 2020-09-26 DIAGNOSIS — Z0000 Encounter for general adult medical examination without abnormal findings: Secondary | ICD-10-CM

## 2020-09-26 DIAGNOSIS — Z1211 Encounter for screening for malignant neoplasm of colon: Secondary | ICD-10-CM | POA: Diagnosis not present

## 2020-09-26 DIAGNOSIS — M810 Age-related osteoporosis without current pathological fracture: Secondary | ICD-10-CM | POA: Diagnosis not present

## 2020-09-26 DIAGNOSIS — E559 Vitamin D deficiency, unspecified: Secondary | ICD-10-CM | POA: Diagnosis not present

## 2020-09-26 LAB — LIPID PANEL
Cholesterol: 217 mg/dL — ABNORMAL HIGH (ref 0–200)
HDL: 93 mg/dL (ref >39.00)
LDL Cholesterol: 115 mg/dL — ABNORMAL HIGH (ref 0–99)
NonHDL: 124.43
Total CHOL/HDL Ratio: 2
Triglycerides: 48 mg/dL (ref 0.0–149.0)
VLDL: 9.6 mg/dL (ref 0.0–40.0)

## 2020-09-26 LAB — CBC WITH DIFFERENTIAL/PLATELET
Basophils Absolute: 0.1 10*3/uL (ref 0.0–0.1)
Basophils Relative: 1.5 % (ref 0.0–3.0)
Eosinophils Absolute: 0.3 10*3/uL (ref 0.0–0.7)
Eosinophils Relative: 6.8 % — ABNORMAL HIGH (ref 0.0–5.0)
HCT: 40.9 % (ref 36.0–46.0)
Hemoglobin: 13.7 g/dL (ref 12.0–15.0)
Lymphocytes Relative: 22.7 % (ref 12.0–46.0)
Lymphs Abs: 0.9 10*3/uL (ref 0.7–4.0)
MCHC: 33.5 g/dL (ref 30.0–36.0)
MCV: 91.7 fl (ref 78.0–100.0)
Monocytes Absolute: 0.4 10*3/uL (ref 0.1–1.0)
Monocytes Relative: 9.7 % (ref 3.0–12.0)
Neutro Abs: 2.4 10*3/uL (ref 1.4–7.7)
Neutrophils Relative %: 59.3 % (ref 43.0–77.0)
Platelets: 246 10*3/uL (ref 150.0–400.0)
RBC: 4.46 Mil/uL (ref 3.87–5.11)
RDW: 12.9 % (ref 11.5–15.5)
WBC: 4.1 10*3/uL (ref 4.0–10.5)

## 2020-09-26 LAB — COMPREHENSIVE METABOLIC PANEL
ALT: 18 U/L (ref 0–35)
AST: 29 U/L (ref 0–37)
Albumin: 4.6 g/dL (ref 3.5–5.2)
Alkaline Phosphatase: 64 U/L (ref 39–117)
BUN: 9 mg/dL (ref 6–23)
CO2: 28 mEq/L (ref 19–32)
Calcium: 9.3 mg/dL (ref 8.4–10.5)
Chloride: 103 mEq/L (ref 96–112)
Creatinine, Ser: 0.61 mg/dL (ref 0.40–1.20)
GFR: 95.83 mL/min (ref >60.00)
Glucose, Bld: 82 mg/dL (ref 70–99)
Potassium: 4.3 mEq/L (ref 3.5–5.1)
Sodium: 139 mEq/L (ref 135–145)
Total Bilirubin: 0.6 mg/dL (ref 0.2–1.2)
Total Protein: 6.7 g/dL (ref 6.0–8.3)

## 2020-09-26 LAB — TSH: TSH: 1.94 u[IU]/mL (ref 0.35–4.50)

## 2020-09-26 NOTE — Progress Notes (Signed)
Subjective:    Patient ID: Catherine Haynes, female    DOB: 1956/03/07, 65 y.o.   MRN: [id number]  Chief Complaint  Patient presents with  . Annual Exam    HPI Patient is in today for annual preventative exam and follow up on chronic medical concerns. No recent febrile illness or hospitalizations. She has her colonoscopy scheduled for May 4 in [place]. No GI concerns althought she does struggle with frequent loose stool. This is a stable but ongoing issue. She has been exercising regularly and has included some upper body work to strengthen her spine. She tries to maintain a heart healthy diet and get calcium 3 x daily. She took her fist shingles shot and had a flu like reaction but feels better now. Denies CP/palp/SOB/HA/congestion/fevers/GI or GU c/o. Taking meds as prescribed  Past Medical History:  Diagnosis Date  .  social 12/29/2014  . Abdominal aortic ectasia ([place]) 12/11/2014  . Allergy   . Atrial fibrillation ([place]) 12/11/2014  . BCC (basal cell carcinoma), face 12/11/2014  . Cervical cancer screening 12/05/2015  . History of shingles 01/24/2017  . Hx: UTI (urinary tract infection) 12/11/2014  . Hyperlipidemia   . Mitral valve prolapse   . Morbid obesity ([place]) 12/11/2014  . Osteoporosis 12/11/2014  . Paroxysmal atrial fibrillation ([place]) 12/11/2014  . Preventative health care 12/05/2015  . Vitamin D deficiency 12/29/2014    Past Surgical History:  Procedure Laterality Date  . CESAREAN SECTION    . TONSILLECTOMY      Family History  Problem Relation Age of Onset  . Stroke Mother        TIA  . Hypertension Mother   . Hyperlipidemia Mother   . Diabetes Mother        diet controlled  . Osteoporosis Mother   . Stroke Father   . Hypertension Father   . Diabetes Father   . Hyperlipidemia Father   . Mental illness Sister 49       suicide  . Heart disease Maternal Grandmother        chf  . Osteoporosis Maternal Grandmother   . Alcohol abuse Maternal Grandfather   . Cancer  Paternal Grandmother   . Diabetes Paternal Grandmother   . Heart disease Paternal Grandfather     Social History   Socioeconomic History  . Marital status: Married    Spouse name: Not on file  . Number of children: Not on file  . Years of education: Not on file  . Highest education level: Not on file  Occupational History  . Occupation: clean houses   Tobacco Use  . Smoking status: Never Smoker  . Smokeless tobacco: Never Used  Vaping Use  . Vaping Use: Never used  Substance and Sexual Activity  . Alcohol use: Yes    Alcohol/week: 0.0 standard drinks    Comment: social.  . Drug use: No  . 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 Not on file  Social History Narrative  . Not on file   Social Determinants of Health   Financial Resource Strain: Not on file  Food Insecurity: Not on file  Transportation Needs: Not on file  Physical Activity: Not on file  Stress: Not on file  Social Connections: Not on file  Intimate Partner Violence: Not on file    Outpatient Medications Prior to Visit  Medication Sig Dispense Refill  . Biotin 5000 MCG TABS  Take 1 tablet by mouth daily.    . Calcium Carbonate (CALCIUM 600 PO) Take 1 tablet by mouth daily.    . Cholecalciferol (VITAMIN D) 50 MCG (2000 UT) tablet Take 2,000 Units by mouth daily.    Marland Kitchen FEXOFENADINE HCL PO Take by mouth.    . Omega-3 Fatty Acids (FISH OIL) 1000 MG CPDR Take 1 capsule by mouth daily.    . nitrofurantoin, macrocrystal-monohydrate, (MACROBID) 100 MG capsule Take 1 capsule (100 mg total) by mouth 2 (two) times daily. 6 capsule 0   No facility-administered medications prior to visit.    No Known Allergies  Review of Systems  Constitutional: Negative for chills, fever and malaise/fatigue.  HENT: Negative for congestion and hearing loss.   Eyes: Negative for discharge.  Respiratory: Negative for cough, sputum production  and shortness of breath.   Cardiovascular: Negative for chest pain, palpitations and leg swelling.  Gastrointestinal: Negative for abdominal pain, blood in stool, constipation, diarrhea, heartburn, nausea and vomiting.  Genitourinary: Negative for dysuria, frequency, hematuria and urgency.  Musculoskeletal: Negative for back pain, falls and myalgias.  Skin: Negative for rash.  Neurological: Negative for dizziness, sensory change, loss of consciousness, weakness and headaches.  Endo/Heme/Allergies: Negative for environmental allergies. Does not bruise/bleed easily.  Psychiatric/Behavioral: Negative for depression and suicidal ideas. The patient is not nervous/anxious and does not have insomnia.        Objective:    Physical Exam Constitutional:      General: She is not in acute distress.    Appearance: She is well-developed.  HENT:     Head: Normocephalic and atraumatic.  Eyes:     Conjunctiva/sclera: Conjunctivae normal.  Neck:     Thyroid: No thyromegaly.  Cardiovascular:     Rate and Rhythm: Normal rate and regular rhythm.     Heart sounds: Normal heart sounds. No murmur heard.   Pulmonary:     Effort: Pulmonary effort is normal. No respiratory distress.     Breath sounds: Normal breath sounds.  Abdominal:     General: Bowel sounds are normal. There is no distension.     Palpations: Abdomen is soft. There is no mass.     Tenderness: There is no abdominal tenderness.  Musculoskeletal:     Cervical back: Neck supple.  Lymphadenopathy:     Cervical: No cervical adenopathy.  Skin:    General: Skin is warm and dry.  Neurological:     Mental Status: She is alert and oriented to person, place, and time.  Psychiatric:        Behavior: Behavior normal.     BP 130/76   Pulse 71   Temp 97.8 F (36.6 C)   Resp 16   Ht 5\' 3"  (1.6 m)   Wt 114 lb (51.7 kg)   SpO2 97%   BMI 20.19 kg/m  Wt Readings from Last 3 Encounters:  09/26/20 114 lb (51.7 kg)  09/24/19 112 lb 9.6  oz (51.1 kg)  07/25/18 107 lb 9.6 oz (48.8 kg)    Diabetic Foot Exam - Simple   No data filed    Lab Results  Component Value Date   WBC 4.2 09/17/2019   HGB 13.6 09/17/2019   HCT 40.6 09/17/2019   PLT 244.0 09/17/2019   GLUCOSE 84 12/24/2019   CHOL 219 (H) 12/24/2019   TRIG 60.0 12/24/2019   HDL 84.60 12/24/2019   LDLCALC 123 (H) 12/24/2019   ALT 16 12/24/2019   AST 19 12/24/2019   NA 140  12/24/2019   K 5.0 12/24/2019   CL 105 12/24/2019   CREATININE 0.64 12/24/2019   BUN 13 12/24/2019   CO2 28 12/24/2019   TSH 3.12 12/24/2019   HGBA1C 5.5 01/24/2017    Lab Results  Component Value Date   TSH 3.12 12/24/2019   Lab Results  Component Value Date   WBC 4.2 09/17/2019   HGB 13.6 09/17/2019   HCT 40.6 09/17/2019   MCV 92.7 09/17/2019   PLT 244.0 09/17/2019   Lab Results  Component Value Date   NA 140 12/24/2019   K 5.0 12/24/2019   CO2 28 12/24/2019   GLUCOSE 84 12/24/2019   BUN 13 12/24/2019   CREATININE 0.64 12/24/2019   BILITOT 0.4 12/24/2019   ALKPHOS 71 12/24/2019   AST 19 12/24/2019   ALT 16 12/24/2019   PROT 6.4 12/24/2019   ALBUMIN 4.4 12/24/2019   CALCIUM 9.4 12/24/2019   GFR 94.14 12/24/2019   Lab Results  Component Value Date   CHOL 219 (H) 12/24/2019   Lab Results  Component Value Date   HDL 84.60 12/24/2019   Lab Results  Component Value Date   LDLCALC 123 (H) 12/24/2019   Lab Results  Component Value Date   TRIG 60.0 12/24/2019   Lab Results  Component Value Date   CHOLHDL 3 12/24/2019   Lab Results  Component Value Date   HGBA1C 5.5 01/24/2017       Assessment & Plan:   Problem List Items Addressed This Visit    Hyperlipidemia - Primary   Relevant Orders   Lipid panel   Comprehensive metabolic panel   Lipid panel   Osteoporosis    Encouraged to get adequate exercise, calcium and vitamin d intake. Repeat Dexa      Relevant Orders   CBC   Colon cancer screening    Has colonoscopy scheduled on Oct 26, 2020       Vitamin D deficiency    Supplement and monitor      Relevant Orders   Comprehensive metabolic panel   Vitamin D 1,25 dihydroxy   CBC   VITAMIN D 25 Hydroxy (Vit-D Deficiency, Fractures)   Preventative health care    Patient encouraged to maintain heart healthy diet, regular exercise, adequate sleep. Consider daily probiotics. Take medications as prescribed. Labs ordered and reviewed.       Relevant Orders   CBC with Differential/Platelet   CBC   Abnormal TSH    Check labs      Relevant Orders   TSH   TSH   T4, free      I have discontinued Shaima Murakami's nitrofurantoin (macrocrystal-monohydrate). I am also having her maintain her Calcium Carbonate (CALCIUM 600 PO), Vitamin D, Fish Oil, Biotin, and FEXOFENADINE HCL PO.  No orders of the defined types were placed in this encounter.    Penni Homans, MD

## 2020-09-26 NOTE — Patient Instructions (Signed)
[employer] at [email or web address], [employer] Multivitamin with minerals, fish or krill oil  Preventive Care 70-63 Years Old, Female Preventive care refers to lifestyle choices and visits with your health care provider that can promote health and wellness. This includes:  A yearly physical exam. This is also called an annual wellness visit.  Regular dental and eye exams.  Immunizations.  Screening for certain conditions.  Healthy lifestyle choices, such as: ? Eating a healthy diet. ? Getting regular exercise. ? Not using drugs or products that contain nicotine and tobacco. ? Limiting alcohol use. What can I expect for my preventive care visit? Physical exam Your health care provider will check your:  Height and weight. These may be used to calculate your BMI (body mass index). BMI is a measurement that tells if you are at a healthy weight.  Heart rate and blood pressure.  Body temperature.  Skin for abnormal spots. Counseling Your health care provider may ask you questions about your:  Past medical problems.  Family's medical history.  Alcohol, tobacco, and drug use.  Emotional well-being.  Home life and relationship well-being.  Sexual activity.  Diet, exercise, and sleep habits.  Work and work Statistician.  Access to firearms.  Method of birth control.  Menstrual cycle.  Pregnancy history. What immunizations do I need? Vaccines are usually given at various ages, according to a schedule. Your health care provider will recommend vaccines for you based on your age, medical history, and lifestyle or other factors, such as travel or where you work.   What tests do I need? Blood tests  Lipid and cholesterol levels. These may be checked every 5 years, or more often if you are over 20 years old.  Hepatitis C test.  Hepatitis B test. Screening  Lung cancer screening. You may have this screening every year starting at age 4 if you have a 30-pack-year history of  smoking and currently smoke or have quit within the past 15 years.  Colorectal cancer screening. ? All adults should have this screening starting at age 54 and continuing until age 45. ? Your health care provider may recommend screening at age 73 if you are at increased risk. ? You will have tests every 1-10 years, depending on your results and the type of screening test.  Diabetes screening. ? This is done by checking your blood sugar (glucose) after you have not eaten for a while (fasting). ? You may have this done every 1-3 years.  Mammogram. ? This may be done every 1-2 years. ? Talk with your health care provider about when you should start having regular mammograms. This may depend on whether you have a family history of breast cancer.  BRCA-related cancer screening. This may be done if you have a family history of breast, ovarian, tubal, or peritoneal cancers.  Pelvic exam and Pap test. ? This may be done every 3 years starting at age 92. ? Starting at age 91, this may be done every 5 years if you have a Pap test in combination with an HPV test. Other tests  STD (sexually transmitted disease) testing, if you are at risk.  Bone density scan. This is done to screen for osteoporosis. You may have this scan if you are at high risk for osteoporosis. Talk with your health care provider about your test results, treatment options, and if necessary, the need for more tests. Follow these instructions at home: Eating and drinking  Eat a diet that includes fresh fruits and vegetables, whole  grains, lean protein, and low-fat dairy products.  Take vitamin and mineral supplements as recommended by your health care provider.  Do not drink alcohol if: ? Your health care provider tells you not to drink. ? You are pregnant, may be pregnant, or are planning to become pregnant.  If you drink alcohol: ? Limit how much you have to 0-1 drink a day. ? Be aware of how much alcohol is in your  drink. In the [place] one drink equals one 12 oz bottle of beer (355 mL), one 5 oz glass of wine (148 mL), or one 1 oz glass of hard liquor (44 mL).   Lifestyle  Take daily care of your teeth and gums. Brush your teeth every morning and night with fluoride toothpaste. Floss one time each day.  Stay active. Exercise for at least 30 minutes 5 or more days each week.  Do not use any products that contain nicotine or tobacco, such as cigarettes, e-cigarettes, and chewing tobacco. If you need help quitting, ask your health care provider.  Do not use drugs.  If you are sexually active, practice safe sex. Use a condom or other form of protection to prevent STIs (sexually transmitted infections).  If you do not wish to become pregnant, use a form of birth control. If you plan to become pregnant, see your health care provider for a prepregnancy visit.  If told by your health care provider, take low-dose aspirin daily starting at age 85.  Find healthy ways to cope with stress, such as: ? Meditation, yoga, or listening to music. ? Journaling. ? Talking to a trusted person. ? Spending time with friends and family. Safety  Always wear your seat belt while driving or riding in a vehicle.  Do not drive: ? If you have been drinking alcohol. Do not ride with someone who has been drinking. ? When you are tired or distracted. ? While texting.  Wear a helmet and other protective equipment during sports activities.  If you have firearms in your house, make sure you follow all gun safety procedures. What's next?  Visit your health care provider once a year for an annual wellness visit.  Ask your health care provider how often you should have your eyes and teeth checked.  Stay up to date on all vaccines. This information is not intended to replace advice given to you by your health care provider. Make sure you discuss any questions you have with your health care provider. Document Revised:  03/15/2020 Document Reviewed: 02/20/2018 Elsevier Patient Education  2021 [employer].

## 2020-09-26 NOTE — Assessment & Plan Note (Signed)
Encouraged to get adequate exercise, calcium and vitamin d intake. Repeat Dexa

## 2020-09-26 NOTE — Assessment & Plan Note (Signed)
Has colonoscopy scheduled on Oct 26, 2020

## 2020-09-26 NOTE — Assessment & Plan Note (Signed)
Supplement and monitor 

## 2020-09-26 NOTE — Assessment & Plan Note (Signed)
Patient encouraged to maintain heart healthy diet, regular exercise, adequate sleep. Consider daily probiotics. Take medications as prescribed. Labs ordered and reviewed 

## 2020-09-26 NOTE — Assessment & Plan Note (Signed)
Check labs 

## 2020-09-29 LAB — VITAMIN D 1,25 DIHYDROXY
Vitamin D 1, 25 (OH)2 Total: 53 pg/mL (ref 18–72)
Vitamin D2 1, 25 (OH)2: <8 pg/mL
Vitamin D3 1, 25 (OH)2: 53 pg/mL

## 2020-09-30 ENCOUNTER — Other Ambulatory Visit: Payer: Self-pay

## 2020-09-30 ENCOUNTER — Ambulatory Visit (HOSPITAL_BASED_OUTPATIENT_CLINIC_OR_DEPARTMENT_OTHER)
Admission: RE | Admit: 2020-09-30 | Discharge: 2020-09-30 | Disposition: A | Discharge status: Home / Self Care | Payer: Managed Care, Other (non HMO) | Source: Ambulatory Visit | Attending: Family Medicine | Admitting: Family Medicine

## 2020-09-30 DIAGNOSIS — Z1231 Encounter for screening mammogram for malignant neoplasm of breast: Secondary | ICD-10-CM | POA: Insufficient documentation

## 2020-11-18 LAB — HM COLONOSCOPY

## 2021-03-03 ENCOUNTER — Encounter: Payer: Self-pay | Admitting: Family Medicine

## 2021-03-03 ENCOUNTER — Ambulatory Visit: Payer: Managed Care, Other (non HMO) | Admitting: Family Medicine

## 2021-03-03 ENCOUNTER — Other Ambulatory Visit: Payer: Self-pay

## 2021-03-03 VITALS — BP 134/82 | HR 82 | Temp 98.8°F | Ht 63.0 in | Wt 114.0 lb

## 2021-03-03 DIAGNOSIS — H9202 Otalgia, left ear: Secondary | ICD-10-CM

## 2021-03-03 MED ORDER — PREDNISONE 20 MG PO TABS: 40.0000 mg | ORAL_TABLET | Freq: Every day | ORAL | 0 refills | Status: AC

## 2021-03-03 NOTE — Patient Instructions (Signed)
OK to use Debrox (peroxide) in the ear to loosen up wax. Also recommend using a bulb syringe (for removing boogers from baby's noses) to flush through warm water and vinegar (3-4:1 ratio). An alternative, though more expensive, is an elephant ear washer wax removal kit. Do not use Q-tips as this can impact wax further.  Let us know if you need anything.

## 2021-03-03 NOTE — Progress Notes (Signed)
Chief Complaint  Patient presents with   Ear Pain    Pt is here for left ear pain. Duration: 1  d Progression: unchanged Associated symptoms: congestion and sinus pressure Denies: Fevers, bleeding, or discharge from ear Treatment to date: Allergy medication, INCS  Past Medical History:  Diagnosis Date    social 12/29/2014   Abdominal aortic ectasia (HCC) 12/11/2014   Allergy    Atrial fibrillation ([place]) 12/11/2014   BCC (basal cell carcinoma), face 12/11/2014   Cervical cancer screening 12/05/2015   History of shingles 01/24/2017   Hx: UTI (urinary tract infection) 12/11/2014   Hyperlipidemia    Mitral valve prolapse    Morbid obesity ([place]) 12/11/2014   Osteoporosis 12/11/2014   Paroxysmal atrial fibrillation (HCC) 12/11/2014   Preventative health care 12/05/2015   Vitamin D deficiency 12/29/2014    BP 134/82   Pulse 82   Temp 98.8 F (37.1 C) (Oral)   Ht '5\' 3"'$  (1.6 m)   Wt 114 lb (51.7 kg)   SpO2 98%   BMI 20.19 kg/m  General: Awake, alert, appearing stated age 65:  L ear- Canal  90% obstructed w cerumen, though without drainage or erythema, TM is neg from what I could see R ear- canal 90% obstructed w cerumen, though without drainage or erythema, TM is neg from what I could see Nose- nares patent and without discharge Mouth- Lips, gums and dentition unremarkable, pharynx is without erythema or exudate Neck: No adenopathy Lungs: Normal effort, no accessory muscle use Psych: Age appropriate judgment and insight, normal mood and affect  Left ear pain - Plan: predniSONE (DELTASONE) 20 MG tablet  5 d pred burst covering for ETD vs effusion. Send message in a few d if no better and would cover for AOM. Wax removal info provided.  F/u prn.  Pt voiced understanding and agreement to the plan.  Betsy Layne, DO 03/03/21 2:29 PM

## 2021-05-09 ENCOUNTER — Ambulatory Visit (HOSPITAL_BASED_OUTPATIENT_CLINIC_OR_DEPARTMENT_OTHER)
Admission: RE | Admit: 2021-05-09 | Discharge: 2021-05-09 | Disposition: A | Discharge status: Home / Self Care | Payer: Managed Care, Other (non HMO) | Source: Ambulatory Visit | Attending: Family | Admitting: Family

## 2021-05-09 ENCOUNTER — Other Ambulatory Visit: Payer: Self-pay | Admitting: Family

## 2021-05-09 ENCOUNTER — Ambulatory Visit: Payer: Managed Care, Other (non HMO) | Admitting: Family

## 2021-05-09 ENCOUNTER — Other Ambulatory Visit: Payer: Self-pay

## 2021-05-09 VITALS — BP 151/66 | HR 75 | Temp 97.9°F | Resp 16 | Ht 63.0 in | Wt 114.0 lb

## 2021-05-09 DIAGNOSIS — M25552 Pain in left hip: Secondary | ICD-10-CM

## 2021-05-09 DIAGNOSIS — R030 Elevated blood-pressure reading, without diagnosis of hypertension: Secondary | ICD-10-CM

## 2021-05-09 MED ORDER — MELOXICAM 7.5 MG PO TABS: 7.5000 mg | ORAL_TABLET | Freq: Every day | ORAL | 0 refills | Status: DC

## 2021-05-09 NOTE — Assessment & Plan Note (Signed)
New. Pt advised as follows:  Please begin meloxicam 7.5mg  once daily as needed for hip pain. Complete hip x-ray on the first floor.  You should be contacted about your referral to Sports Medicine.

## 2021-05-09 NOTE — Patient Instructions (Signed)
Please begin meloxicam 7.5mg  once daily as needed for hip pain. Complete hip x-ray on the first floor.  You should be contacted about your referral to Sports Medicine.

## 2021-05-09 NOTE — Progress Notes (Signed)
Subjective:   By signing my name below, I, Lyric Barr-McArthur, attest that this documentation has been prepared under the direction and in the presence of Debbrah Alar, NP, 05/09/2021     Patient ID: Catherine Haynes, female    DOB: 27-Dec-1955, 65 y.o.   MRN: [id number]  Chief Complaint  Patient presents with   Hip Pain    Complains of left hip pain since "summer".     HPI Patient is in today for an office visit.  Hip pain: She complains of pain on the left side of her hip. She notes that she has osteoporosis and tries to stay as active as possible to help with this. She mentions that this has been going on for the over 3 months since she went on a difficult hike. She takes ibuprofen to help with her pain and also notes that with movement it is worsened. She also says that with going from a sitting to standing or standing to sitting position heightens the pain.   Health Maintenance Due  Topic Date Due   Pneumococcal Vaccine 65-42 Years old (1 - PCV) Never done   HIV Screening  Never done   COLONOSCOPY (Pts 45-39yrs Insurance coverage will need to be confirmed)  Never done   PAP SMEAR-Modifier  12/05/2018   COVID-19 Vaccine (4 - Booster for Moderna series) 07/07/2020   Zoster Vaccines- Shingrix (2 of 2) 10/13/2020   INFLUENZA VACCINE  01/23/2021    Past Medical History:  Diagnosis Date    social 12/29/2014   Abdominal aortic ectasia ([place]) 12/11/2014   Allergy    Atrial fibrillation ([place]) 12/11/2014   BCC (basal cell carcinoma), face 12/11/2014   Cervical cancer screening 12/05/2015   History of shingles 01/24/2017   Hx: UTI (urinary tract infection) 12/11/2014   Hyperlipidemia    Mitral valve prolapse    Morbid obesity ([place]) 12/11/2014   Osteoporosis 12/11/2014   Paroxysmal atrial fibrillation ([place]) 12/11/2014   Preventative health care 12/05/2015   Vitamin D deficiency 12/29/2014    Past Surgical History:  Procedure Laterality Date   CESAREAN SECTION     TONSILLECTOMY       Family History  Problem Relation Age of Onset   Stroke Mother        TIA   Hypertension Mother    Hyperlipidemia Mother    Diabetes Mother        diet controlled   Osteoporosis Mother    Stroke Father    Hypertension Father    Diabetes Father    Hyperlipidemia Father    Mental illness Sister 57       suicide   Heart disease Maternal Grandmother        chf   Osteoporosis Maternal Grandmother    Alcohol abuse Maternal Grandfather    Cancer Paternal Grandmother    Diabetes Paternal Grandmother    Heart disease Paternal Grandfather     Social History   Socioeconomic History   Marital status: Married    Spouse name: Not on file   Number of children: Not on file   Years of education: Not on file   Highest education level: Not on file  Occupational History   Occupation: clean houses   Tobacco Use   Smoking status: Never   Smokeless tobacco: Never  Vaping Use   Vaping Use: Never used  Substance and Sexual Activity   Alcohol use: Yes    Alcohol/week: 0.0 standard drinks    Comment: social.   Drug  use: No   Sexual activity: Yes    Comment: lives with husband and daughter, house cleaner, no dietary restrictions  Other Topics Concern   Not on file  Social History Narrative   Not on file   Social Determinants of Health   Financial Resource Strain: Not on file  Food Insecurity: Not on file  Transportation Needs: Not on file  Physical Activity: Not on file  Stress: Not on file  Social Connections: Not on file  Intimate Partner Violence: Not on file    Outpatient Medications Prior to Visit  Medication Sig Dispense Refill   Calcium Carbonate (CALCIUM 600 PO) Take 1 tablet by mouth daily.     Cholecalciferol (VITAMIN D) 50 MCG (2000 UT) tablet Take 2,000 Units by mouth daily.     FEXOFENADINE HCL PO Take by mouth.     Omega-3 Fatty Acids (FISH OIL) 1000 MG CPDR Take 1 capsule by mouth daily.     Biotin 5000 MCG TABS Take 1 tablet by mouth daily.     No  facility-administered medications prior to visit.    No Known Allergies  Review of Systems  Musculoskeletal:        (+) pain in left hip      Objective:    Physical Exam Constitutional:      General: She is not in acute distress.    Appearance: Normal appearance. She is not ill-appearing.  HENT:     Head: Normocephalic and atraumatic.     Right Ear: External ear normal.     Left Ear: External ear normal.  Eyes:     Extraocular Movements: Extraocular movements intact.     Pupils: Pupils are equal, round, and reactive to light.  Cardiovascular:     Rate and Rhythm: Normal rate and regular rhythm.     Heart sounds: Normal heart sounds. No murmur heard.   No gallop.  Pulmonary:     Effort: Pulmonary effort is normal. No respiratory distress.     Breath sounds: Normal breath sounds. No wheezing or rales.  Musculoskeletal:     Left hip: Normal. No tenderness. Normal range of motion.  Lymphadenopathy:     Cervical: No cervical adenopathy.  Skin:    General: Skin is warm and dry.  Neurological:     Mental Status: She is alert and oriented to person, place, and time.  Psychiatric:        Behavior: Behavior normal.        Judgment: Judgment normal.    BP (!) 151/66 (BP Location: Right Arm, Patient Position: Sitting, Cuff Size: Small)   Pulse 75   Temp 97.9 F (36.6 C) (Oral)   Resp 16   Ht 5\' 3"  (1.6 m)   Wt 114 lb (51.7 kg)   SpO2 100%   BMI 20.19 kg/m  Wt Readings from Last 3 Encounters:  05/09/21 114 lb (51.7 kg)  03/03/21 114 lb (51.7 kg)  09/26/20 114 lb (51.7 kg)       Assessment & Plan:   Problem List Items Addressed This Visit       Unprioritized   Left hip pain - Primary    New. Pt advised as follows:  Please begin meloxicam 7.5mg  once daily as needed for hip pain. Complete hip x-ray on the first floor.  You should be contacted about your referral to Sports Medicine.       Relevant Orders   DG HIP UNILAT WITH PELVIS 2-3 VIEWS RIGHT    Ambulatory  referral to Sports Medicine   Elevated blood pressure reading    New.  Pt is advised to work on low sodium diet and to follow up in 2 weeks for blood pressure recheck.       Meds ordered this encounter  Medications   meloxicam (MOBIC) 7.5 MG tablet    Sig: Take 1 tablet (7.5 mg total) by mouth daily.    Dispense:  14 tablet    Refill:  0    Order Specific Question:   Supervising Provider    Answer:   Penni Homans A [[id number]]    I, Debbrah Alar, NP, personally preformed the services described in this documentation.  All medical record entries made by the scribe were at my direction and in my presence.  I have reviewed the chart and discharge instructions (if applicable) and agree that the record reflects my personal performance and is accurate and complete. 05/09/2021  I,Lyric Barr-McArthur,acting as a Education administrator for Nance Pear, NP.,have documented all relevant documentation on the behalf of Nance Pear, NP,as directed by  Nance Pear, NP while in the presence of Nance Pear, NP.  Nance Pear, NP

## 2021-05-09 NOTE — Assessment & Plan Note (Signed)
New.  Pt is advised to work on low sodium diet and to follow up in 2 weeks for blood pressure recheck.

## 2021-05-11 ENCOUNTER — Ambulatory Visit: Payer: Self-pay

## 2021-05-11 ENCOUNTER — Ambulatory Visit: Payer: Managed Care, Other (non HMO) | Admitting: Family Medicine

## 2021-05-11 VITALS — BP 140/80 | Ht 63.0 in | Wt 114.0 lb

## 2021-05-11 DIAGNOSIS — M24159 Other articular cartilage disorders, unspecified hip: Secondary | ICD-10-CM | POA: Insufficient documentation

## 2021-05-11 MED ORDER — TRIAMCINOLONE ACETONIDE 40 MG/ML IJ SUSP
40.0000 mg | Freq: Once | INTRAMUSCULAR | Status: AC
Start: 2021-05-11 — End: 2021-05-11
  Administered 2021-05-11: 17:00:00 40 mg via INTRA_ARTICULAR

## 2021-05-11 NOTE — Patient Instructions (Signed)
Nice to meet you Please try ice  Please try the exercises  Please look up evenity   Please send me a message in MyChart with any questions or updates.  Please see me back in 4 weeks.   --Dr. Raeford Razor

## 2021-05-11 NOTE — Progress Notes (Signed)
[name] - 65 y.o. female MRN [id number]  Date of birth: Apr 29, 1956  SUBJECTIVE:  Including CC & ROS.  No chief complaint on file.   Catherine Haynes is a 65 y.o. female that is presenting with left inguinal pain.  The pain has been ongoing for a few weeks.  It is worse with certain activities and is inconsistent with presentation.  No radicular pain.  Independent review of AP pelvis and left hip x-ray from 11/15 shows no acute changes.   Review of Systems See HPI   HISTORY: Past Medical, Surgical, Social, and Family History Reviewed & Updated per EMR.   Pertinent Historical Findings include:  Past Medical History:  Diagnosis Date    social 12/29/2014   Abdominal aortic ectasia (HCC) 12/11/2014   Allergy    Atrial fibrillation ([place]) 12/11/2014   BCC (basal cell carcinoma), face 12/11/2014   Cervical cancer screening 12/05/2015   History of shingles 01/24/2017   Hx: UTI (urinary tract infection) 12/11/2014   Hyperlipidemia    Mitral valve prolapse    Morbid obesity ([place]) 12/11/2014   Osteoporosis 12/11/2014   Paroxysmal atrial fibrillation ([place]) 12/11/2014   Preventative health care 12/05/2015   Vitamin D deficiency 12/29/2014    Past Surgical History:  Procedure Laterality Date   CESAREAN SECTION     TONSILLECTOMY      Family History  Problem Relation Age of Onset   Stroke Mother        TIA   Hypertension Mother    Hyperlipidemia Mother    Diabetes Mother        diet controlled   Osteoporosis Mother    Stroke Father    Hypertension Father    Diabetes Father    Hyperlipidemia Father    Mental illness Sister 26       suicide   Heart disease Maternal Grandmother        chf   Osteoporosis Maternal Grandmother    Alcohol abuse Maternal Grandfather    Cancer Paternal Grandmother    Diabetes Paternal Grandmother    Heart disease Paternal Grandfather     Social History   Socioeconomic History   Marital status: Married    Spouse name: Not on file   Number of  children: Not on file   Years of education: Not on file   Highest education level: Not on file  Occupational History   Occupation: clean houses   Tobacco Use   Smoking status: Never   Smokeless tobacco: Never  Vaping Use   Vaping Use: Never used  Substance and Sexual Activity   Alcohol use: Yes    Alcohol/week: 0.0 standard drinks    Comment: social.   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, house cleaner, no dietary restrictions  Other Topics Concern   Not on file  Social History Narrative   Not on file   Social Determinants of Health   Financial Resource Strain: Not on file  Food Insecurity: Not on file  Transportation Needs: Not on file  Physical Activity: Not on file  Stress: Not on file  Social Connections: Not on file  Intimate Partner Violence: Not on file     PHYSICAL EXAM:  VS: BP 140/80 (BP Location: Left Arm, Patient Position: Sitting)   Ht 5\' 3"  (1.6 m)   Wt 114 lb (51.7 kg)   BMI 20.19 kg/m  Physical Exam Gen: NAD, alert, cooperative with exam, well-appearing  Aspiration/Injection Procedure Note Catherine Haynes 1955/09/09  Procedure: Injection Indications: Left hip pain  Procedure Details Consent: Risks of procedure as well as the alternatives and risks of each were explained to the (patient/caregiver).  Consent for procedure obtained. Time Out: Verified patient identification, verified procedure, site/side was marked, verified correct patient position, special equipment/implants available, medications/allergies/relevent history reviewed, required imaging and test results available.  Performed.  The area was cleaned with iodine and alcohol swabs.    The left hip joint was injected using 3 cc 1% lidocaine on a 22-gauge 3-1/2 inch needle.  The syringe was switched to mixture containing 1 cc's of 40 mg Kenalog and 4 cc's of 0.25% bupivacaine was injected.  Ultrasound was used. Images were obtained in short views  showing the injection.     A sterile dressing was applied.  Patient did tolerate procedure well.      ASSESSMENT & PLAN:   Labral tear of hip, degenerative She does have pain over the greater trochanter but symptoms seem most consistent with instability with the labrum.  Had a mild effusion on exam and no significant degenerative changes observed on x-ray.  Seem less likely for stress fracture at this time -Counseled on home exercise therapy and supportive care. -Injection today. -Could consider physical therapy or further imaging.

## 2021-05-11 NOTE — Assessment & Plan Note (Signed)
She does have pain over the greater trochanter but symptoms seem most consistent with instability with the labrum.  Had a mild effusion on exam and no significant degenerative changes observed on x-ray.  Seem less likely for stress fracture at this time -Counseled on home exercise therapy and supportive care. -Injection today. -Could consider physical therapy or further imaging.

## 2021-05-12 ENCOUNTER — Ambulatory Visit: Payer: Managed Care, Other (non HMO) | Admitting: Family Medicine

## 2021-05-16 ENCOUNTER — Telehealth: Payer: Self-pay | Admitting: Family Medicine

## 2021-05-16 NOTE — Telephone Encounter (Signed)
Patient wanted to make an appointment with Catherine Haynes for her ongoing hip issues, she was offered an appointment with another provider due to [name] busy schedule but she refuse stating she only wants to see [name]. She was informed that the next available appointment was not until March 21st. She was upset and stated that she could not wait that long, she wanted a message sent to see if anything could open up sooner. Please advice.

## 2021-05-17 NOTE — Telephone Encounter (Signed)
Are you ok with a 1pm or 4pm?  If so will thursdays be best for you?

## 2021-05-17 NOTE — Telephone Encounter (Signed)
Patient scheduled for 12/22

## 2021-05-26 ENCOUNTER — Ambulatory Visit (INDEPENDENT_AMBULATORY_CARE_PROVIDER_SITE_OTHER): Payer: Managed Care, Other (non HMO) | Admitting: Family

## 2021-05-26 VITALS — BP 138/78 | HR 79

## 2021-05-26 DIAGNOSIS — R030 Elevated blood-pressure reading, without diagnosis of hypertension: Secondary | ICD-10-CM

## 2021-05-26 NOTE — Progress Notes (Signed)
Pt here for Blood pressure check per Debbrah Alar.  Pt is currently on no medications.  Pt reports compliance with low sodium.  BP Readings from Last 3 Encounters:  05/11/21 140/80  05/09/21 (!) 151/66  03/03/21 134/82   Checked first with patient bp monitor: 160/70  HR-80 Recheck bp with pt bp monitor- 142/79 HR 76  BP today @ = 138/78 HR =79  Patient is concerned because her mother and father has blood pressure issues.  Pt advised per Melissa patient to check twice a week and to send [place] readings if she has any concerns.  Also advised patient to wait about 15-20 minutes before checking and to try to be more relaxed when checking.

## 2021-06-13 ENCOUNTER — Encounter: Payer: Self-pay | Admitting: Family Medicine

## 2021-06-13 ENCOUNTER — Ambulatory Visit (INDEPENDENT_AMBULATORY_CARE_PROVIDER_SITE_OTHER): Payer: Managed Care, Other (non HMO) | Admitting: Family Medicine

## 2021-06-13 VITALS — BP 130/80 | Ht 63.0 in | Wt 114.0 lb

## 2021-06-13 DIAGNOSIS — M24159 Other articular cartilage disorders, unspecified hip: Secondary | ICD-10-CM | POA: Diagnosis not present

## 2021-06-13 NOTE — Progress Notes (Signed)
Catherine Haynes - 65 y.o. female MRN [id number]  Date of birth: 12/14/1955  SUBJECTIVE:  Including CC & ROS.  No chief complaint on file.   Catherine Haynes is a 65 y.o. female that is following up for her left hip pain.  She has had significant improvement with the hip injection.  Still has minor pain when she is doing flexion activities..   Review of Systems See HPI   HISTORY: Past Medical, Surgical, Social, and Family History Reviewed & Updated per EMR.   Pertinent Historical Findings include:  Past Medical History:  Diagnosis Date    social 12/29/2014   Abdominal aortic ectasia (HCC) 12/11/2014   Allergy    Atrial fibrillation ([place]) 12/11/2014   BCC (basal cell carcinoma), face 12/11/2014   Cervical cancer screening 12/05/2015   History of shingles 01/24/2017   Hx: UTI (urinary tract infection) 12/11/2014   Hyperlipidemia    Mitral valve prolapse    Morbid obesity ([place]) 12/11/2014   Osteoporosis 12/11/2014   Paroxysmal atrial fibrillation ([place]) 12/11/2014   Preventative health care 12/05/2015   Vitamin D deficiency 12/29/2014    Past Surgical History:  Procedure Laterality Date   CESAREAN SECTION     TONSILLECTOMY      Family History  Problem Relation Age of Onset   Stroke Mother        TIA   Hypertension Mother    Hyperlipidemia Mother    Diabetes Mother        diet controlled   Osteoporosis Mother    Stroke Father    Hypertension Father    Diabetes Father    Hyperlipidemia Father    Mental illness Sister 57       suicide   Heart disease Maternal Grandmother        chf   Osteoporosis Maternal Grandmother    Alcohol abuse Maternal Grandfather    Cancer Paternal Grandmother    Diabetes Paternal Grandmother    Heart disease Paternal Grandfather     Social History   Socioeconomic History   Marital status: Married    Spouse name: Not on file   Number of children: Not on file   Years of education: Not on file   Highest education level: Not on file  Occupational  History   Occupation: clean houses   Tobacco Use   Smoking status: Never   Smokeless tobacco: Never  Vaping Use   Vaping Use: Never used  Substance and Sexual Activity   Alcohol use: Yes    Alcohol/week: 0.0 standard drinks    Comment: social.   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, house cleaner, no dietary restrictions  Other Topics Concern   Not on file  Social History Narrative   Not on file   Social Determinants of Health   Financial Resource Strain: Not on file  Food Insecurity: Not on file  Transportation Needs: Not on file  Physical Activity: Not on file  Stress: Not on file  Social Connections: Not on file  Intimate Partner Violence: Not on file     PHYSICAL EXAM:  VS: BP 130/80 (BP Location: Left Arm, Patient Position: Sitting)    Ht 5\' 3"  (1.6 m)    Wt 114 lb (51.7 kg)    BMI 20.19 kg/m  Physical Exam Gen: NAD, alert, cooperative with exam, well-appearing    ASSESSMENT & PLAN:   Labral tear of hip, degenerative Significant improvement with the injection.   -  Counseled on home exercise therapy and supportive care. -Referral to physical therapy. -Counseled on compression. -Could consider further imaging.

## 2021-06-13 NOTE — Patient Instructions (Signed)
Good to see you  Please try physical therapy  Please consider the compression  Please send me a message in MyChart with any questions or updates.  Please see me back in 1-2 months.   --Dr. Raeford Razor

## 2021-06-13 NOTE — Assessment & Plan Note (Signed)
Significant improvement with the injection.   -Counseled on home exercise therapy and supportive care. -Referral to physical therapy. -Counseled on compression. -Could consider further imaging.

## 2021-06-15 ENCOUNTER — Encounter: Payer: Self-pay | Admitting: Family Medicine

## 2021-06-15 ENCOUNTER — Ambulatory Visit: Payer: Managed Care, Other (non HMO) | Admitting: Family Medicine

## 2021-06-15 VITALS — BP 132/80 | HR 95 | Temp 97.6°F | Resp 16 | Ht 63.0 in | Wt 109.6 lb

## 2021-06-15 DIAGNOSIS — M810 Age-related osteoporosis without current pathological fracture: Secondary | ICD-10-CM | POA: Diagnosis not present

## 2021-06-15 DIAGNOSIS — E785 Hyperlipidemia, unspecified: Secondary | ICD-10-CM | POA: Diagnosis not present

## 2021-06-15 DIAGNOSIS — E559 Vitamin D deficiency, unspecified: Secondary | ICD-10-CM | POA: Diagnosis not present

## 2021-06-15 DIAGNOSIS — R739 Hyperglycemia, unspecified: Secondary | ICD-10-CM

## 2021-06-15 DIAGNOSIS — R7989 Other specified abnormal findings of blood chemistry: Secondary | ICD-10-CM

## 2021-06-15 DIAGNOSIS — M25552 Pain in left hip: Secondary | ICD-10-CM

## 2021-06-15 DIAGNOSIS — M25559 Pain in unspecified hip: Secondary | ICD-10-CM

## 2021-06-15 NOTE — Assessment & Plan Note (Addendum)
Is noted to have arthritic changes and a labral tear. She is following with sports medicine and they did use a steroid shot and she is now starting Physical Therapy she will let us know if it worsens. Consider Topical Lidocaine prn

## 2021-06-15 NOTE — Assessment & Plan Note (Signed)
Continue to monitor

## 2021-06-15 NOTE — Assessment & Plan Note (Signed)
Supplement and monitor 

## 2021-06-15 NOTE — Assessment & Plan Note (Signed)
She continues to be very hesitant to take a medication for her osteoporosis but agrees to consider after repeat Dexa scan. Encouraged to get adequate exercise, calcium and vitamin d intake

## 2021-06-15 NOTE — Patient Instructions (Addendum)
Needs a lab appt next week on same day as dexa scan CBD Daily at [facility] Body Lidocaine gel  Hip Pain The hip is the joint between the upper legs and the lower pelvis. The bones, cartilage, tendons, and muscles of your hip joint support your body and allow you to move around. Hip pain can range from a minor ache to severe pain in one or both of your hips. The pain may be felt on the inside of the hip joint near the groin, or on the outside near the buttocks and upper thigh. You may also have swelling or stiffness in your hip area. Follow these instructions at home: Managing pain, stiffness, and swelling   If directed, put ice on the painful area. To do this: Put ice in a plastic bag. Place a towel between your skin and the bag. Leave the ice on for 20 minutes, 2-3 times a day. If directed, apply heat to the affected area as often as told by your health care provider. Use the heat source that your health care provider recommends, such as a moist heat pack or a heating pad. Place a towel between your skin and the heat source. Leave the heat on for 20-30 minutes. Remove the heat if your skin turns bright red. This is especially important if you are unable to feel pain, heat, or cold. You may have a greater risk of getting burned. Activity Do exercises as told by your health care provider. Avoid activities that cause pain. General instructions  Take over-the-counter and prescription medicines only as told by your health care provider. Keep a journal of your symptoms. Write down: How often you have hip pain. The location of your pain. What the pain feels like. What makes the pain worse. Sleep with a pillow between your legs on your most comfortable side. Keep all follow-up visits as told by your health care provider. This is important. Contact a health care provider if: You cannot put weight on your leg. Your pain or swelling continues or gets worse after one week. It gets harder to  walk. You have a fever. Get help right away if: You fall. You have a sudden increase in pain and swelling in your hip. Your hip is red or swollen or very tender to touch. Summary Hip pain can range from a minor ache to severe pain in one or both of your hips. The pain may be felt on the inside of the hip joint near the groin, or on the outside near the buttocks and upper thigh. Avoid activities that cause pain. Write down how often you have hip pain, the location of the pain, what makes it worse, and what it feels like. This information is not intended to replace advice given to you by your health care provider. Make sure you discuss any questions you have with your health care provider. Document Revised: 10/27/2018 Document Reviewed: 10/27/2018 Elsevier Patient Education  [place].

## 2021-06-15 NOTE — Progress Notes (Signed)
Patient ID: Catherine Haynes, female    DOB: 1956-01-19  Age: 65 y.o. MRN: [id number]    Subjective:   Chief Complaint  Patient presents with   Hip Problem   Subjective   HPI Catherine Haynes presents for office visit today for follow up on elevated BP and hip issues. She expresses concern regarding her high BP readings during her last visit here, but thankfully her readings home were less concerning. Endorses walking and upper body exercises. Started this summer due to her hip problems. Denies CP/palp/SOB/HA/congestion/fevers/GI or GU c/o. Taking meds as prescribed.  FMHx: Both parents smoked, and father diseased from stroke. There is also history of diabetes in both parents.  Review of Systems  Constitutional:  Negative for chills, fatigue and fever.  HENT:  Negative for congestion, rhinorrhea, sinus pressure, sinus pain and sore throat.   Eyes:  Negative for pain.  Respiratory:  Negative for cough and shortness of breath.   Cardiovascular:  Negative for chest pain, palpitations and leg swelling.  Gastrointestinal:  Negative for abdominal pain, blood in stool, diarrhea, nausea and vomiting.  Genitourinary:  Negative for decreased urine volume, flank pain, frequency, vaginal bleeding and vaginal discharge.  Musculoskeletal:  Negative for back pain.  Neurological:  Negative for headaches.   History Past Medical History:  Diagnosis Date    social 12/29/2014   Abdominal aortic ectasia (HCC) 12/11/2014   Allergy    Atrial fibrillation ([place]) 12/11/2014   BCC (basal cell carcinoma), face 12/11/2014   Cervical cancer screening 12/05/2015   History of shingles 01/24/2017   Hx: UTI (urinary tract infection) 12/11/2014   Hyperlipidemia    Mitral valve prolapse    Morbid obesity ([place]) 12/11/2014   Osteoporosis 12/11/2014   Paroxysmal atrial fibrillation ([place]) 12/11/2014   Preventative health care 12/05/2015   Vitamin D deficiency 12/29/2014    She has a past surgical history that includes Cesarean  section and Tonsillectomy.   Her family history includes Alcohol abuse in her maternal grandfather; Cancer in her paternal grandmother; Diabetes in her father, mother, and paternal grandmother; Heart disease in her maternal grandmother and paternal grandfather; Hyperlipidemia in her father and mother; Hypertension in her father and mother; Mental illness (age of onset: 43) in her sister; Osteoporosis in her maternal grandmother and mother; Stroke in her father and mother.She reports that she has never smoked. She has never used smokeless tobacco. She reports current alcohol use. She reports that she does not use drugs.  Current Outpatient Medications on File Prior to Visit  Medication Sig Dispense Refill   Calcium Carbonate (CALCIUM 600 PO) Take 1 tablet by mouth daily.     Cholecalciferol (VITAMIN D) 50 MCG (2000 UT) tablet Take 2,000 Units by mouth daily.     FEXOFENADINE HCL PO Take by mouth.     meloxicam (MOBIC) 7.5 MG tablet Take 1 tablet (7.5 mg total) by mouth daily. 14 tablet 0   Omega-3 Fatty Acids (FISH OIL) 1000 MG CPDR Take 1 capsule by mouth daily.     No current facility-administered medications on file prior to visit.     Objective:  Objective  Physical Exam Constitutional:      General: She is not in acute distress.    Appearance: Normal appearance. She is not ill-appearing or toxic-appearing.  HENT:     Head: Normocephalic and atraumatic.     Right Ear: Tympanic membrane, ear canal and external ear normal.     Left Ear: Tympanic membrane, ear canal and external ear  normal.     Nose: No congestion or rhinorrhea.  Eyes:     Extraocular Movements: Extraocular movements intact.     Pupils: Pupils are equal, round, and reactive to light.  Cardiovascular:     Rate and Rhythm: Normal rate and regular rhythm.     Pulses: Normal pulses.     Heart sounds: Normal heart sounds. No murmur heard. Pulmonary:     Effort: Pulmonary effort is normal. No respiratory distress.      Breath sounds: Normal breath sounds. No wheezing, rhonchi or rales.  Abdominal:     General: Bowel sounds are normal.     Palpations: Abdomen is soft. There is no mass.     Tenderness: There is no abdominal tenderness. There is no guarding.     Hernia: No hernia is present.  Musculoskeletal:        General: Normal range of motion.     Cervical back: Normal range of motion and neck supple.  Skin:    General: Skin is warm and dry.  Neurological:     Mental Status: She is alert and oriented to person, place, and time.  Psychiatric:        Behavior: Behavior normal.   BP 132/80    Pulse 95    Temp 97.6 F (36.4 C)    Resp 16    Ht 5\' 3"  (1.6 m)    Wt 109 lb 9.6 oz (49.7 kg)    SpO2 99%    BMI 19.41 kg/m  Wt Readings from Last 3 Encounters:  06/15/21 109 lb 9.6 oz (49.7 kg)  06/13/21 114 lb (51.7 kg)  05/11/21 114 lb (51.7 kg)     Lab Results  Component Value Date   WBC 4.1 09/26/2020   HGB 13.7 09/26/2020   HCT 40.9 09/26/2020   PLT 246.0 09/26/2020   GLUCOSE 82 09/26/2020   CHOL 217 (H) 09/26/2020   TRIG 48.0 09/26/2020   HDL 93.00 09/26/2020   LDLCALC 115 (H) 09/26/2020   ALT 18 09/26/2020   AST 29 09/26/2020   NA 139 09/26/2020   K 4.3 09/26/2020   CL 103 09/26/2020   CREATININE 0.61 09/26/2020   BUN 9 09/26/2020   CO2 28 09/26/2020   TSH 1.94 09/26/2020   HGBA1C 5.5 01/24/2017    DG HIP UNILAT WITH PELVIS 2-3 VIEWS LEFT  Result Date: 05/10/2021 CLINICAL DATA:  Left hip pain EXAM: DG HIP (WITH OR WITHOUT PELVIS) 2-3V LEFT COMPARISON:  None. FINDINGS: No recent fracture or dislocation is seen. There is no significant joint space narrowing. There are no focal lytic lesions. There are small linear smooth marginated calcifications in the soft tissues along the lateral margin of greater trochanter of proximal left femur. There are small soft tissue calcifications adjacent to the ischial tuberosity. IMPRESSION: No recent fracture or dislocation is seen in the left hip.  Small smooth marginated calcifications adjacent to the lateral margin of greater trochanter of proximal left femur and left ischial tuberosity may suggest ligament calcifications from previous injury or calcific bursitis or tendinosis. Electronically Signed   By: Elmer Picker M.D.   On: 05/10/2021 14:53     Assessment & Plan:  Plan    No orders of the defined types were placed in this encounter.   Problem List Items Addressed This Visit     Hyperlipidemia   Relevant Orders   Lipid panel   Osteoporosis - Primary    She continues to be very hesitant to take  a medication for her osteoporosis but agrees to consider after repeat Dexa scan. Encouraged to get adequate exercise, calcium and vitamin d intake      Relevant Orders   DG Bone Density   Vitamin D deficiency    Supplement and monitor      Relevant Orders   Comprehensive metabolic panel   VITAMIN D 25 Hydroxy (Vit-D Deficiency, Fractures)   Abnormal TSH    Continue to monitor      Relevant Orders   TSH   Left hip pain    Is noted to have arthritic changes and a labral tear. She is following with sports medicine and they did use a steroid shot and she is now starting Physical Therapy she will let us know if it worsens. Consider Topical Lidocaine prn      Other Visit Diagnoses     Hyperglycemia       Relevant Orders   Hemoglobin A1c   Hip pain       Relevant Orders   CBC       Follow-up: Return in about 3 months (around 09/13/2021) for f/u visit.  I, Suezanne Jacquet, acting as a scribe for Penni Homans, MD, have documented all relevent documentation on behalf of Penni Homans, MD, as directed by Penni Homans, MD while in the presence of Penni Homans, MD. DO:06/15/21.  I, Mosie Lukes, MD personally performed the services described in this documentation. All medical record entries made by the scribe were at my direction and in my presence. I have reviewed the chart and agree that the record reflects my personal  performance and is accurate and complete

## 2021-08-24 ENCOUNTER — Other Ambulatory Visit (HOSPITAL_BASED_OUTPATIENT_CLINIC_OR_DEPARTMENT_OTHER): Payer: Managed Care, Other (non HMO)

## 2021-08-31 ENCOUNTER — Other Ambulatory Visit: Payer: Self-pay

## 2021-08-31 ENCOUNTER — Ambulatory Visit (HOSPITAL_BASED_OUTPATIENT_CLINIC_OR_DEPARTMENT_OTHER)
Admission: RE | Admit: 2021-08-31 | Discharge: 2021-08-31 | Disposition: A | Discharge status: Home / Self Care | Payer: Managed Care, Other (non HMO) | Source: Ambulatory Visit | Attending: Family Medicine | Admitting: Family Medicine

## 2021-08-31 ENCOUNTER — Other Ambulatory Visit (HOSPITAL_BASED_OUTPATIENT_CLINIC_OR_DEPARTMENT_OTHER): Payer: Managed Care, Other (non HMO)

## 2021-08-31 DIAGNOSIS — M810 Age-related osteoporosis without current pathological fracture: Secondary | ICD-10-CM

## 2021-09-26 ENCOUNTER — Other Ambulatory Visit: Payer: Managed Care, Other (non HMO)

## 2021-09-28 ENCOUNTER — Encounter: Payer: Managed Care, Other (non HMO) | Admitting: Family Medicine

## 2021-10-17 ENCOUNTER — Other Ambulatory Visit (INDEPENDENT_AMBULATORY_CARE_PROVIDER_SITE_OTHER): Payer: Managed Care, Other (non HMO)

## 2021-10-17 DIAGNOSIS — E559 Vitamin D deficiency, unspecified: Secondary | ICD-10-CM

## 2021-10-17 DIAGNOSIS — R739 Hyperglycemia, unspecified: Secondary | ICD-10-CM

## 2021-10-17 DIAGNOSIS — E785 Hyperlipidemia, unspecified: Secondary | ICD-10-CM | POA: Diagnosis not present

## 2021-10-17 DIAGNOSIS — R7989 Other specified abnormal findings of blood chemistry: Secondary | ICD-10-CM | POA: Diagnosis not present

## 2021-10-17 DIAGNOSIS — M25559 Pain in unspecified hip: Secondary | ICD-10-CM

## 2021-10-17 LAB — COMPREHENSIVE METABOLIC PANEL
ALT: 16 U/L (ref 0–35)
AST: 20 U/L (ref 0–37)
Albumin: 4.5 g/dL (ref 3.5–5.2)
Alkaline Phosphatase: 58 U/L (ref 39–117)
BUN: 9 mg/dL (ref 6–23)
CO2: 26 mEq/L (ref 19–32)
Calcium: 9.1 mg/dL (ref 8.4–10.5)
Chloride: 104 mEq/L (ref 96–112)
Creatinine, Ser: 0.61 mg/dL (ref 0.40–1.20)
GFR: 95.12 mL/min (ref >60.00)
Glucose, Bld: 87 mg/dL (ref 70–99)
Potassium: 4.1 mEq/L (ref 3.5–5.1)
Sodium: 139 mEq/L (ref 135–145)
Total Bilirubin: 0.7 mg/dL (ref 0.2–1.2)
Total Protein: 6.5 g/dL (ref 6.0–8.3)

## 2021-10-17 LAB — HEMOGLOBIN A1C: Hgb A1c MFr Bld: 5.7 % (ref 4.6–6.5)

## 2021-10-17 LAB — CBC
HCT: 40.8 % (ref 36.0–46.0)
Hemoglobin: 13.4 g/dL (ref 12.0–15.0)
MCHC: 33 g/dL (ref 30.0–36.0)
MCV: 93 fl (ref 78.0–100.0)
Platelets: 246 10*3/uL (ref 150.0–400.0)
RBC: 4.38 Mil/uL (ref 3.87–5.11)
RDW: 12.3 % (ref 11.5–15.5)
WBC: 4.8 10*3/uL (ref 4.0–10.5)

## 2021-10-17 LAB — LIPID PANEL
Cholesterol: 201 mg/dL — ABNORMAL HIGH (ref 0–200)
HDL: 78.2 mg/dL (ref >39.00)
LDL Cholesterol: 112 mg/dL — ABNORMAL HIGH (ref 0–99)
NonHDL: 123.08
Total CHOL/HDL Ratio: 3
Triglycerides: 57 mg/dL (ref 0.0–149.0)
VLDL: 11.4 mg/dL (ref 0.0–40.0)

## 2021-10-17 LAB — VITAMIN D 25 HYDROXY (VIT D DEFICIENCY, FRACTURES): VITD: 35.34 ng/mL (ref 30.00–100.00)

## 2021-10-17 LAB — TSH: TSH: 3.32 u[IU]/mL (ref 0.35–5.50)

## 2021-10-23 NOTE — Progress Notes (Signed)
? ?Subjective:  ? ? Patient ID: Catherine Haynes, female    DOB: 1956/01/18, 66 y.o.   MRN: [id number] ? ?Chief Complaint  ?Patient presents with  ? Annual Exam  ? ? ?HPI ?Patient is in today for her annual physical exam and follow up on chronic medical concerns. She denies any recent febrile illness or hospitalizations. She has been under a great deal of stress due to caring for sick family members. As a result her anxiety, her blood pressure and her pulse have been up some. She is trying to stay active and notes her hip pain has improved some with therapy. She maintains a heart healthy diet. Denies CP/palp/SOB/HA/congestion/fevers/GI or GU c/o. Taking meds as prescribed. She notes more trouble sleeping with all of her stress.  ? ?Past Medical History:  ?Diagnosis Date  ?  social 12/29/2014  ? Abdominal aortic ectasia ([place]) 12/11/2014  ? Allergy   ? Anxiety 10/24/2021  ? Atrial fibrillation ([place]) 12/11/2014  ? BCC (basal cell carcinoma), face 12/11/2014  ? Cervical cancer screening 12/05/2015  ? History of shingles 01/24/2017  ? Hx: UTI (urinary tract infection) 12/11/2014  ? Hyperlipidemia   ? Mitral valve prolapse   ? Morbid obesity ([place]) 12/11/2014  ? Osteoporosis 12/11/2014  ? Paroxysmal atrial fibrillation ([place]) 12/11/2014  ? Preventative health care 12/05/2015  ? Vitamin D deficiency 12/29/2014  ? ? ?Past Surgical History:  ?Procedure Laterality Date  ? CESAREAN SECTION    ? TONSILLECTOMY    ? ? ?Family History  ?Problem Relation Age of Onset  ? Stroke Mother   ?     TIA  ? Hypertension Mother   ? Hyperlipidemia Mother   ? Diabetes Mother   ?     diet controlled  ? Osteoporosis Mother   ? Stroke Father   ? Hypertension Father   ? Diabetes Father   ? Hyperlipidemia Father   ? Mental illness Sister 46  ?     suicide  ? Heart disease Maternal Grandmother   ?     chf  ? Osteoporosis Maternal Grandmother   ? Alcohol abuse Maternal Grandfather   ? Cancer Paternal Grandmother   ? Diabetes Paternal Grandmother   ? Heart disease Paternal  Grandfather   ? ? ?Social History  ? ?Socioeconomic History  ? Marital status: Married  ?  Spouse name: Not on file  ? Number of children: Not on file  ? Years of education: Not on file  ? Highest education level: Not on file  ?Occupational History  ? Occupation: clean houses   ?Tobacco Use  ? Smoking status: Never  ? Smokeless tobacco: Never  ?Vaping Use  ? Vaping Use: Never used  ?Substance and Sexual Activity  ? Alcohol use: Yes  ?  Alcohol/week: 0.0 standard drinks  ?  Comment: social.  ? Drug use: No  ? Sexual activity: Yes  ?  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 Not on file  ?Social History Narrative  ? Not on file  ? ?Social Determinants of Health  ? ?Financial Resource Strain: Not on file  ?Food Insecurity: Not on file  ?Transportation Needs: Not on file  ?Physical Activity: Not on file  ?Stress: Not on file  ?Social Connections: Not on file  ?Intimate Partner Violence: Not on file  ? ? ?Outpatient Medications Prior to Visit  ?Medication Sig Dispense Refill  ? Calcium Carbonate (CALCIUM 600 PO) Take 1 tablet by  mouth 2 (two) times daily.    ? Cholecalciferol (VITAMIN D) 50 MCG (2000 UT) tablet Take 2,000 Units by mouth daily.    ? FEXOFENADINE HCL PO Take by mouth.    ? meloxicam (MOBIC) 7.5 MG tablet Take 1 tablet (7.5 mg total) by mouth daily. 14 tablet 0  ? Omega-3 Fatty Acids (FISH OIL) 1000 MG CPDR Take 1 capsule by mouth daily.    ? ?No facility-administered medications prior to visit.  ? ? ?No Known Allergies ? ?Review of Systems  ?Constitutional:  Positive for malaise/fatigue. Negative for chills and fever.  ?HENT:  Negative for congestion and hearing loss.   ?Eyes:  Negative for discharge.  ?Respiratory:  Negative for cough, sputum production and shortness of breath.   ?Cardiovascular:  Negative for chest pain, palpitations and leg swelling.  ?Gastrointestinal:  Negative for abdominal pain, blood in stool, constipation, diarrhea,  heartburn, nausea and vomiting.  ?Genitourinary:  Negative for dysuria, frequency, hematuria and urgency.  ?Musculoskeletal:  Positive for joint pain. Negative for back pain, falls and myalgias.  ?Skin:  Negative for rash.  ?Neurological:  Negative for dizziness, sensory change, loss of consciousness, weakness and headaches.  ?Endo/Heme/Allergies:  Negative for environmental allergies. Does not bruise/bleed easily.  ?Psychiatric/Behavioral:  Negative for depression and suicidal ideas. The patient is nervous/anxious and has insomnia.   ? ?   ?Objective:  ?  ?Physical Exam ? ?BP 118/70 (BP Location: Right Arm, Patient Position: Sitting, Cuff Size: Normal)   Pulse 88   Resp 20   Ht '5\' 3"'$  (1.6 m)   Wt 107 lb 6.4 oz (48.7 kg)   SpO2 99%   BMI 19.03 kg/m?  ?Wt Readings from Last 3 Encounters:  ?10/24/21 107 lb 6.4 oz (48.7 kg)  ?06/15/21 109 lb 9.6 oz (49.7 kg)  ?06/13/21 114 lb (51.7 kg)  ? ? ?Diabetic Foot Exam - Simple   ?No data filed ?  ? ?Lab Results  ?Component Value Date  ? WBC 4.8 10/17/2021  ? HGB 13.4 10/17/2021  ? HCT 40.8 10/17/2021  ? PLT 246.0 10/17/2021  ? GLUCOSE 87 10/17/2021  ? CHOL 201 (H) 10/17/2021  ? TRIG 57.0 10/17/2021  ? HDL 78.20 10/17/2021  ? LDLCALC 112 (H) 10/17/2021  ? ALT 16 10/17/2021  ? AST 20 10/17/2021  ? NA 139 10/17/2021  ? K 4.1 10/17/2021  ? CL 104 10/17/2021  ? CREATININE 0.61 10/17/2021  ? BUN 9 10/17/2021  ? CO2 26 10/17/2021  ? TSH 3.32 10/17/2021  ? HGBA1C 5.7 10/17/2021  ? ? ?Lab Results  ?Component Value Date  ? TSH 3.32 10/17/2021  ? ?Lab Results  ?Component Value Date  ? WBC 4.8 10/17/2021  ? HGB 13.4 10/17/2021  ? HCT 40.8 10/17/2021  ? MCV 93.0 10/17/2021  ? PLT 246.0 10/17/2021  ? ?Lab Results  ?Component Value Date  ? NA 139 10/17/2021  ? K 4.1 10/17/2021  ? CO2 26 10/17/2021  ? GLUCOSE 87 10/17/2021  ? BUN 9 10/17/2021  ? CREATININE 0.61 10/17/2021  ? BILITOT 0.7 10/17/2021  ? ALKPHOS 58 10/17/2021  ? AST 20 10/17/2021  ? ALT 16 10/17/2021  ? PROT 6.5 10/17/2021   ? ALBUMIN 4.5 10/17/2021  ? CALCIUM 9.1 10/17/2021  ? GFR 95.12 10/17/2021  ? ?Lab Results  ?Component Value Date  ? CHOL 201 (H) 10/17/2021  ? ?Lab Results  ?Component Value Date  ? HDL 78.20 10/17/2021  ? ?Lab Results  ?Component Value Date  ? [place] 112 (  H) 10/17/2021  ? ?Lab Results  ?Component Value Date  ? TRIG 57.0 10/17/2021  ? ?Lab Results  ?Component Value Date  ? CHOLHDL 3 10/17/2021  ? ?Lab Results  ?Component Value Date  ? HGBA1C 5.7 10/17/2021  ? ? ?   ?Assessment & Plan:  ? ?Problem List Items Addressed This Visit   ? ? Hyperlipidemia  ?  Encourage heart healthy diet such as MIND or DASH diet, increase exercise, avoid trans fats, simple carbohydrates and processed foods, consider a krill or fish or flaxseed oil cap daily.  ? ?  ?  ? Relevant Medications  ? metoprolol succinate (TOPROL-XL) 25 MG 24 hr tablet  ? Osteoporosis  ?  Dexa was March 2023 repeat in 2 years. Encouraged to get adequate exercise, calcium and vitamin d intake. Results stable so patient chooses to hold off on taking medications at this time.  ? ?  ?  ? Vitamin D deficiency  ?  Supplement and monitor ? ?  ?  ? Cervical cancer screening  ?  Pap today, no concerns on exam.  ? ?  ?  ? Relevant Orders  ? Cytology - PAP  ? Preventative health care - Primary  ?  Patient encouraged to maintain heart healthy diet, regular exercise, adequate sleep. Consider daily probiotics. Take medications as prescribed. Labs ordered and reviewed. Colonoscopy 2022 repeat in 10 years. Pap today. MM April 2022 repeat this year. dexa 2023 repeat in 2 years. Discussed need for ACP paperwork given ? ?  ?  ? Relevant Orders  ? Cytology - PAP  ? Abnormal TSH  ?  Continue to monitor ? ?  ?  ? Left hip pain  ?  Improved with PT.  ? ?  ?  ? Elevated blood pressure reading  ?  Started on Metoprolol XL 25 mg daily. Encouraged heart healthy diet such as the DASH diet and exercise as tolerated.  ? ?  ?  ? Anxiety  ?  Has been having to care for an ill husband and  mother so has been under a great deal of stress. Her anxiety and her pulse are up.  After discussion of options including SSRI, beta blockade and Benzos will start Metoprolol XL 25 mg po daily and can use Alp

## 2021-10-24 ENCOUNTER — Other Ambulatory Visit (HOSPITAL_COMMUNITY)
Admission: RE | Admit: 2021-10-24 | Discharge: 2021-10-24 | Disposition: A | Discharge status: Home / Self Care | Payer: Managed Care, Other (non HMO) | Source: Ambulatory Visit | Attending: Family Medicine | Admitting: Family Medicine

## 2021-10-24 ENCOUNTER — Encounter: Payer: Self-pay | Admitting: Family Medicine

## 2021-10-24 ENCOUNTER — Ambulatory Visit (INDEPENDENT_AMBULATORY_CARE_PROVIDER_SITE_OTHER): Payer: Managed Care, Other (non HMO) | Admitting: Family Medicine

## 2021-10-24 VITALS — BP 118/70 | HR 88 | Resp 20 | Ht 63.0 in | Wt 107.4 lb

## 2021-10-24 DIAGNOSIS — R030 Elevated blood-pressure reading, without diagnosis of hypertension: Secondary | ICD-10-CM

## 2021-10-24 DIAGNOSIS — Z1239 Encounter for other screening for malignant neoplasm of breast: Secondary | ICD-10-CM

## 2021-10-24 DIAGNOSIS — Z124 Encounter for screening for malignant neoplasm of cervix: Secondary | ICD-10-CM

## 2021-10-24 DIAGNOSIS — M810 Age-related osteoporosis without current pathological fracture: Secondary | ICD-10-CM

## 2021-10-24 DIAGNOSIS — E559 Vitamin D deficiency, unspecified: Secondary | ICD-10-CM | POA: Diagnosis not present

## 2021-10-24 DIAGNOSIS — Z0000 Encounter for general adult medical examination without abnormal findings: Secondary | ICD-10-CM | POA: Diagnosis not present

## 2021-10-24 DIAGNOSIS — F419 Anxiety disorder, unspecified: Secondary | ICD-10-CM

## 2021-10-24 DIAGNOSIS — E785 Hyperlipidemia, unspecified: Secondary | ICD-10-CM | POA: Diagnosis not present

## 2021-10-24 DIAGNOSIS — M25552 Pain in left hip: Secondary | ICD-10-CM | POA: Diagnosis not present

## 2021-10-24 DIAGNOSIS — R7989 Other specified abnormal findings of blood chemistry: Secondary | ICD-10-CM

## 2021-10-24 HISTORY — DX: Anxiety disorder, unspecified: F41.9

## 2021-10-24 MED ORDER — ALPRAZOLAM 0.25 MG PO TABS: 0.2500 mg | ORAL_TABLET | Freq: Two times a day (BID) | ORAL | 1 refills | Status: DC | PRN

## 2021-10-24 MED ORDER — METOPROLOL SUCCINATE ER 25 MG PO TB24: 12.5000 mg | ORAL_TABLET | Freq: Every day | ORAL | 3 refills | Status: DC

## 2021-10-24 NOTE — Assessment & Plan Note (Signed)
Pap today, no concerns on exam.  

## 2021-10-24 NOTE — Assessment & Plan Note (Signed)
Encourage heart healthy diet such as MIND or DASH diet, increase exercise, avoid trans fats, simple carbohydrates and processed foods, consider a krill or fish or flaxseed oil cap daily.  °

## 2021-10-24 NOTE — Assessment & Plan Note (Addendum)
Dexa was March 2023 repeat in 2 years. Encouraged to get adequate exercise, calcium and vitamin d intake. Results stable so patient chooses to hold off on taking medications at this time.  ?

## 2021-10-24 NOTE — Patient Instructions (Signed)
[employer] environmental working group helps with product selection. ?Benzene is in dry shampoo, try Odele has a shampoo without Benzene ? ? ?Preventive Care 31-64 Years Old, Female ?Preventive care refers to lifestyle choices and visits with your health care provider that can promote health and wellness. Preventive care visits are also called wellness exams. ?What can I expect for my preventive care visit? ?Counseling ?Your health care provider may ask you questions about your: ?Medical history, including: ?Past medical problems. ?Family medical history. ?Pregnancy history. ?Current health, including: ?Menstrual cycle. ?Method of birth control. ?Emotional well-being. ?Home life and relationship well-being. ?Sexual activity and sexual health. ?Lifestyle, including: ?Alcohol, nicotine or tobacco, and drug use. ?Access to firearms. ?Diet, exercise, and sleep habits. ?Work and work Statistician. ?Sunscreen use. ?Safety issues such as seatbelt and bike helmet use. ?Physical exam ?Your health care provider will check your: ?Height and weight. These may be used to calculate your BMI (body mass index). BMI is a measurement that tells if you are at a healthy weight. ?Waist circumference. This measures the distance around your waistline. This measurement also tells if you are at a healthy weight and may help predict your risk of certain diseases, such as type 2 diabetes and high blood pressure. ?Heart rate and blood pressure. ?Body temperature. ?Skin for abnormal spots. ?What immunizations do I need? ? ?Vaccines are usually given at various ages, according to a schedule. Your health care provider will recommend vaccines for you based on your age, medical history, and lifestyle or other factors, such as travel or where you work. ?What tests do I need? ?Screening ?Your health care provider may recommend screening tests for certain conditions. This may include: ?Lipid and cholesterol levels. ?Diabetes screening. This is done by  checking your blood sugar (glucose) after you have not eaten for a while (fasting). ?Pelvic exam and Pap test. ?Hepatitis B test. ?Hepatitis C test. ?HIV (human immunodeficiency virus) test. ?STI (sexually transmitted infection) testing, if you are at risk. ?Lung cancer screening. ?Colorectal cancer screening. ?Mammogram. Talk with your health care provider about when you should start having regular mammograms. This may depend on whether you have a family history of breast cancer. ?BRCA-related cancer screening. This may be done if you have a family history of breast, ovarian, tubal, or peritoneal cancers. ?Bone density scan. This is done to screen for osteoporosis. ?Talk with your health care provider about your test results, treatment options, and if necessary, the need for more tests. ?Follow these instructions at home: ?Eating and drinking ? ?Eat a diet that includes fresh fruits and vegetables, whole grains, lean protein, and low-fat dairy products. ?Take vitamin and mineral supplements as recommended by your health care provider. ?Do not drink alcohol if: ?Your health care provider tells you not to drink. ?You are pregnant, may be pregnant, or are planning to become pregnant. ?If you drink alcohol: ?Limit how much you have to 0-1 drink a day. ?Know how much alcohol is in your drink. In the [place] one drink equals one 12 oz bottle of beer (355 mL), one 5 oz glass of wine (148 mL), or one 1? oz glass of hard liquor (44 mL). ?Lifestyle ?Brush your teeth every morning and night with fluoride toothpaste. Floss one time each day. ?Exercise for at least 30 minutes 5 or more days each week. ?Do not use any products that contain nicotine or tobacco. These products include cigarettes, chewing tobacco, and vaping devices, such as e-cigarettes. If you need help quitting, ask your health care  provider. ?Do not use drugs. ?If you are sexually active, practice safe sex. Use a condom or other form of protection to prevent  STIs. ?If you do not wish to become pregnant, use a form of birth control. If you plan to become pregnant, see your health care provider for a prepregnancy visit. ?Take aspirin only as told by your health care provider. Make sure that you understand how much to take and what form to take. Work with your health care provider to find out whether it is safe and beneficial for you to take aspirin daily. ?Find healthy ways to manage stress, such as: ?Meditation, yoga, or listening to music. ?Journaling. ?Talking to a trusted person. ?Spending time with friends and family. ?Minimize exposure to UV radiation to reduce your risk of skin cancer. ?Safety ?Always wear your seat belt while driving or riding in a vehicle. ?Do not drive: ?If you have been drinking alcohol. Do not ride with someone who has been drinking. ?When you are tired or distracted. ?While texting. ?If you have been using any mind-altering substances or drugs. ?Wear a helmet and other protective equipment during sports activities. ?If you have firearms in your house, make sure you follow all gun safety procedures. ?Seek help if you have been physically or sexually abused. ?What's next? ?Visit your health care provider once a year for an annual wellness visit. ?Ask your health care provider how often you should have your eyes and teeth checked. ?Stay up to date on all vaccines. ?This information is not intended to replace advice given to you by your health care provider. Make sure you discuss any questions you have with your health care provider. ?Document Revised: 12/07/2020 Document Reviewed: 12/07/2020 ?Elsevier Patient Education ? [place]. ? ?

## 2021-10-24 NOTE — Assessment & Plan Note (Signed)
Improved with PT.

## 2021-10-24 NOTE — Assessment & Plan Note (Addendum)
Patient encouraged to maintain heart healthy diet, regular exercise, adequate sleep. Consider daily probiotics. Take medications as prescribed. Labs ordered and reviewed. Colonoscopy 2022 repeat in 10 years. Pap today. MM April 2022 repeat this year. dexa 2023 repeat in 2 years. Discussed need for ACP paperwork given. MM ordered today. Discussed need for ACP documents, paperwork given ?

## 2021-10-24 NOTE — Assessment & Plan Note (Signed)
Supplement and monitor 

## 2021-10-25 NOTE — Assessment & Plan Note (Signed)
Continue to monitor

## 2021-10-25 NOTE — Assessment & Plan Note (Signed)
Started on Metoprolol XL 25 mg daily. Encouraged heart healthy diet such as the DASH diet and exercise as tolerated.  ?

## 2021-10-25 NOTE — Assessment & Plan Note (Signed)
Has been having to care for an ill husband and mother so has been under a great deal of stress. Her anxiety and her pulse are up.  After discussion of options including SSRI, beta blockade and Benzos will start Metoprolol XL 25 mg po daily and can use Alprazolam 0.25 mg po bid prn, sparingly to manage difficult moments. If stress worsens consider starting a low dose SSRI temporarily for a few months ?

## 2021-10-26 LAB — CYTOLOGY - PAP
Comment: NEGATIVE
Diagnosis: NEGATIVE
High risk HPV: NEGATIVE

## 2021-12-19 ENCOUNTER — Ambulatory Visit (HOSPITAL_BASED_OUTPATIENT_CLINIC_OR_DEPARTMENT_OTHER)
Admission: RE | Admit: 2021-12-19 | Discharge: 2021-12-19 | Disposition: A | Discharge status: Home / Self Care | Payer: Managed Care, Other (non HMO) | Source: Ambulatory Visit | Attending: Family Medicine | Admitting: Family Medicine

## 2021-12-19 ENCOUNTER — Encounter (HOSPITAL_BASED_OUTPATIENT_CLINIC_OR_DEPARTMENT_OTHER): Payer: Self-pay

## 2021-12-19 DIAGNOSIS — Z1231 Encounter for screening mammogram for malignant neoplasm of breast: Secondary | ICD-10-CM | POA: Diagnosis present

## 2021-12-19 DIAGNOSIS — Z1239 Encounter for other screening for malignant neoplasm of breast: Secondary | ICD-10-CM

## 2021-12-27 NOTE — Progress Notes (Deleted)
MyChart Video Visit    Virtual Visit via Video Note   This visit type was conducted due to national recommendations for restrictions regarding the COVID-19 Pandemic (e.g. social distancing) in an effort to limit this patient's exposure and mitigate transmission in our community. This patient is at least at moderate risk for complications without adequate follow up. This format is felt to be most appropriate for this patient at this time. Physical exam was limited by quality of the video and audio technology used for the visit. Catherine Haynes., CMA was able to get the patient set up on a video visit.  Patient location: Home Patient and provider in visit Provider location: Office  I discussed the limitations of evaluation and management by telemedicine and the availability of in person appointments. The patient expressed understanding and agreed to proceed.  Visit Date: 12/28/2021  Today's healthcare provider: Penni Homans, MD     Subjective:    Patient ID: Catherine Haynes, female    DOB: 1955/10/11, 66 y.o.   MRN: [id number]  No chief complaint on file.   HPI Patient is in today for a follow up.  Past Medical History:  Diagnosis Date    social 12/29/2014   Abdominal aortic ectasia ([place]) 12/11/2014   Allergy    Anxiety 10/24/2021   Atrial fibrillation ([place]) 12/11/2014   BCC (basal cell carcinoma), face 12/11/2014   Cervical cancer screening 12/05/2015   History of shingles 01/24/2017   Hx: UTI (urinary tract infection) 12/11/2014   Hyperlipidemia    Mitral valve prolapse    Morbid obesity ([place]) 12/11/2014   Osteoporosis 12/11/2014   Paroxysmal atrial fibrillation ([place]) 12/11/2014   Preventative health care 12/05/2015   Vitamin D deficiency 12/29/2014    Past Surgical History:  Procedure Laterality Date   CESAREAN SECTION     TONSILLECTOMY      Family History  Problem Relation Age of Onset   Stroke Mother        TIA   Hypertension Mother    Hyperlipidemia Mother    Diabetes  Mother        diet controlled   Osteoporosis Mother    Stroke Father    Hypertension Father    Diabetes Father    Hyperlipidemia Father    Mental illness Sister 75       suicide   Heart disease Maternal Grandmother        chf   Osteoporosis Maternal Grandmother    Alcohol abuse Maternal Grandfather    Cancer Paternal Grandmother    Diabetes Paternal Grandmother    Heart disease Paternal Grandfather     Social History   Socioeconomic History   Marital status: Married    Spouse name: Not on file   Number of children: Not on file   Years of education: Not on file   Highest education level: Not on file  Occupational History   Occupation: clean houses   Tobacco Use   Smoking status: Never   Smokeless tobacco: Never  Vaping Use   Vaping Use: Never used  Substance and Sexual Activity   Alcohol use: Yes    Alcohol/week: 0.0 standard drinks of alcohol    Comment: social.   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 20 tablet 1   Calcium Carbonate (CALCIUM 600 PO) Take 1 tablet by mouth 2 (two) times daily.     Cholecalciferol (VITAMIN D) 50 MCG (2000 UT) tablet Take 2,000 Units by mouth daily.     FEXOFENADINE HCL PO Take by mouth.     meloxicam (MOBIC) 7.5 MG tablet Take 1 tablet (7.5 mg total) by mouth daily. 14 tablet 0   metoprolol succinate (TOPROL-XL) 25 MG 24  hr tablet Take 0.5-1 tablets (12.5-25 mg total) by mouth daily. 30 tablet 3   Omega-3 Fatty Acids (FISH OIL) 1000 MG CPDR Take 1 capsule by mouth daily.     No facility-administered medications prior to visit.    No Known Allergies  ROS     Objective:    Physical Exam  There were no vitals taken for this visit. Wt Readings from Last 3 Encounters:  10/24/21 107 lb 6.4 oz (48.7 kg)  06/15/21 109 lb 9.6 oz (49.7 kg)  06/13/21 114 lb (51.7 kg)    Diabetic Foot Exam - Simple   No data filed    Lab Results  Component Value Date   WBC 4.8 10/17/2021   HGB 13.4 10/17/2021   HCT 40.8 10/17/2021   PLT 246.0 10/17/2021   GLUCOSE 87 10/17/2021   CHOL 201 (H) 10/17/2021   TRIG 57.0 10/17/2021   HDL 78.20 10/17/2021   LDLCALC 112 (H) 10/17/2021   ALT 16 10/17/2021   AST 20 10/17/2021   NA 139 10/17/2021   K 4.1 10/17/2021   CL 104 10/17/2021   CREATININE 0.61 10/17/2021   BUN 9 10/17/2021   CO2 26 10/17/2021   TSH 3.32 10/17/2021   HGBA1C 5.7 10/17/2021    Lab Results  Component Value Date   TSH 3.32 10/17/2021   Lab Results  Component Value Date   WBC 4.8 10/17/2021   HGB 13.4 10/17/2021   HCT 40.8 10/17/2021   MCV 93.0 10/17/2021   PLT 246.0 10/17/2021   Lab Results  Component Value Date   NA 139 10/17/2021   K 4.1 10/17/2021   CO2 26 10/17/2021   GLUCOSE 87 10/17/2021   BUN 9 10/17/2021   CREATININE 0.61 10/17/2021   BILITOT 0.7 10/17/2021   ALKPHOS 58 10/17/2021   AST 20 10/17/2021   ALT 16 10/17/2021   PROT 6.5 10/17/2021   ALBUMIN 4.5 10/17/2021   CALCIUM 9.1 10/17/2021   GFR 95.12 10/17/2021   Lab Results  Component Value Date   CHOL 201 (H) 10/17/2021   Lab Results  Component Value Date   HDL 78.20 10/17/2021   Lab Results  Component Value Date   LDLCALC 112 (H) 10/17/2021   Lab Results  Component Value Date   TRIG 57.0 10/17/2021   Lab Results  Component Value Date   CHOLHDL 3 10/17/2021   Lab Results  Component Value Date    HGBA1C 5.7 10/17/2021       Assessment & Plan:   Problem List Items Addressed This Visit   None   I am having Catherine  Haynes maintain her Calcium Carbonate (CALCIUM 600 PO), Vitamin D, Fish Oil, FEXOFENADINE HCL PO, meloxicam, metoprolol succinate, and ALPRAZolam.  No orders of the defined types were placed in this encounter.   I discussed the assessment and treatment plan with the patient. The patient was provided an opportunity to ask questions and all were answered. The patient agreed with the plan and demonstrated an understanding of the instructions.   The patient was advised to call back or seek an in-person evaluation if the symptoms worsen or if the condition fails to improve as anticipated.     Penni Homans, MD [facility] at [facility] [phone] (phone) [phone] (fax)  [place]

## 2021-12-28 ENCOUNTER — Telehealth: Payer: Managed Care, Other (non HMO) | Admitting: Family Medicine

## 2022-01-19 ENCOUNTER — Encounter: Payer: Self-pay | Admitting: Family Medicine

## 2022-04-04 IMAGING — DX DG HIP (WITH OR WITHOUT PELVIS) 2-3V*L*
3 series · 3 of 3 positions shown · non-contrast
Comparison: None.

CLINICAL DATA: Left hip pain

EXAM:
DG HIP (WITH OR WITHOUT PELVIS) 2-3V LEFT

[pelvis ap]
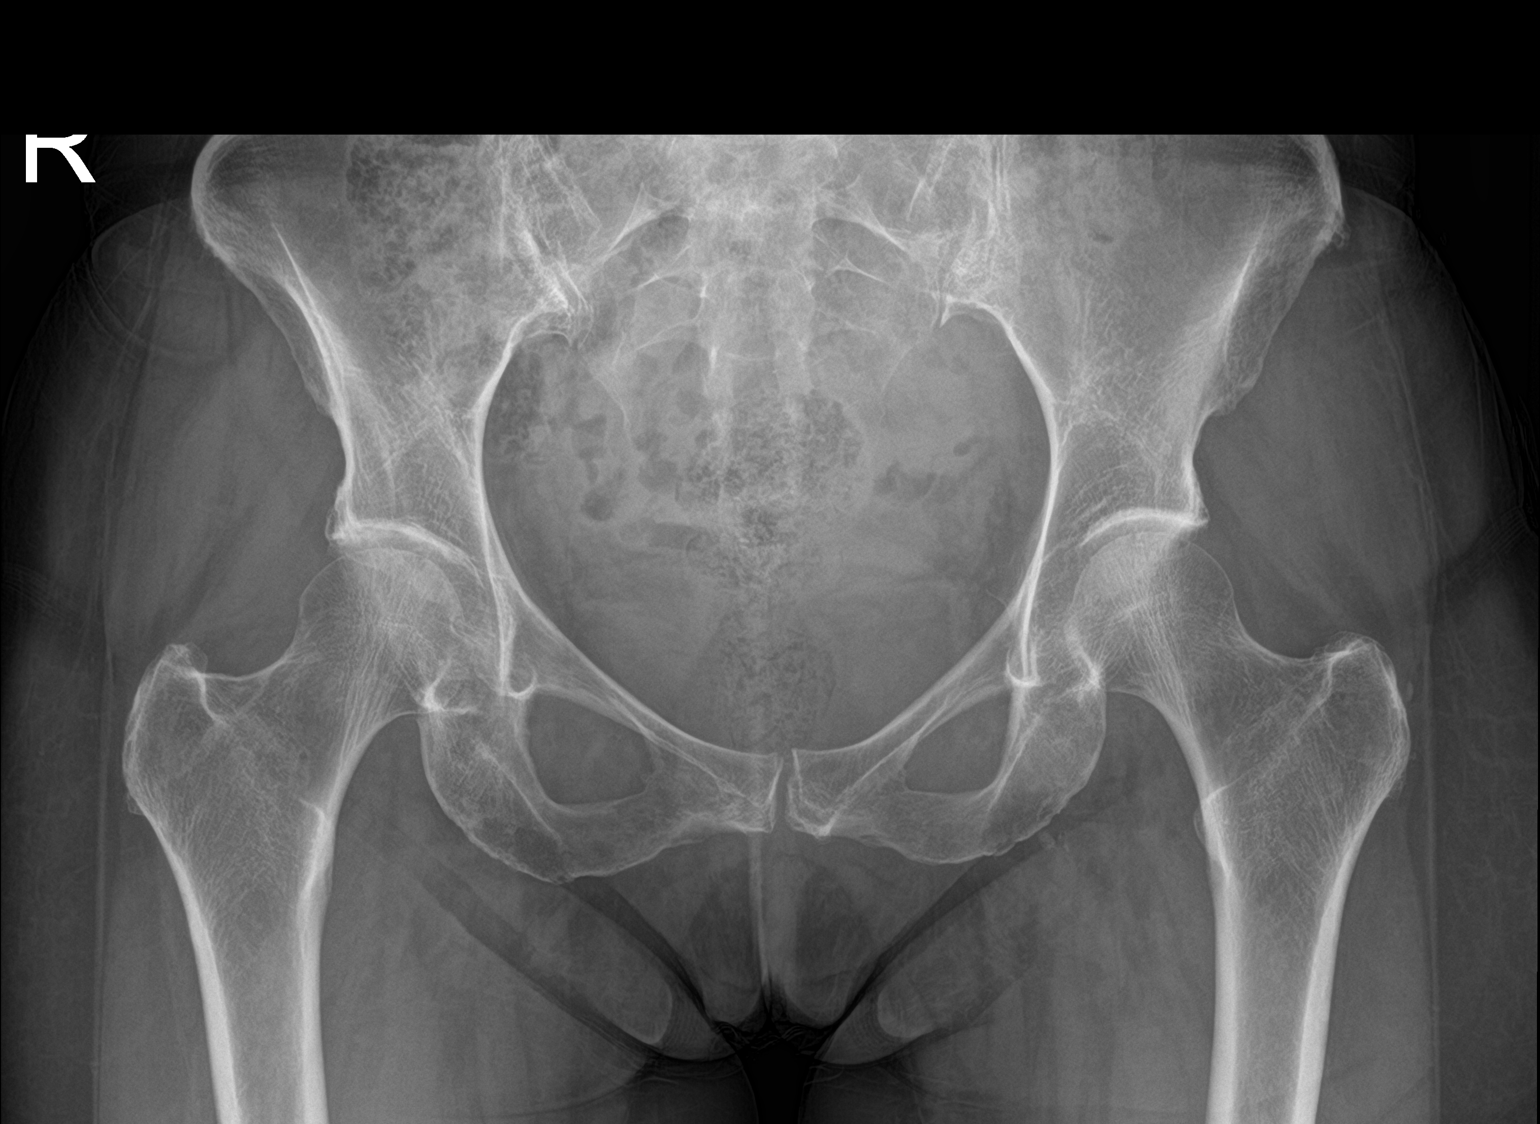

[hip ap]
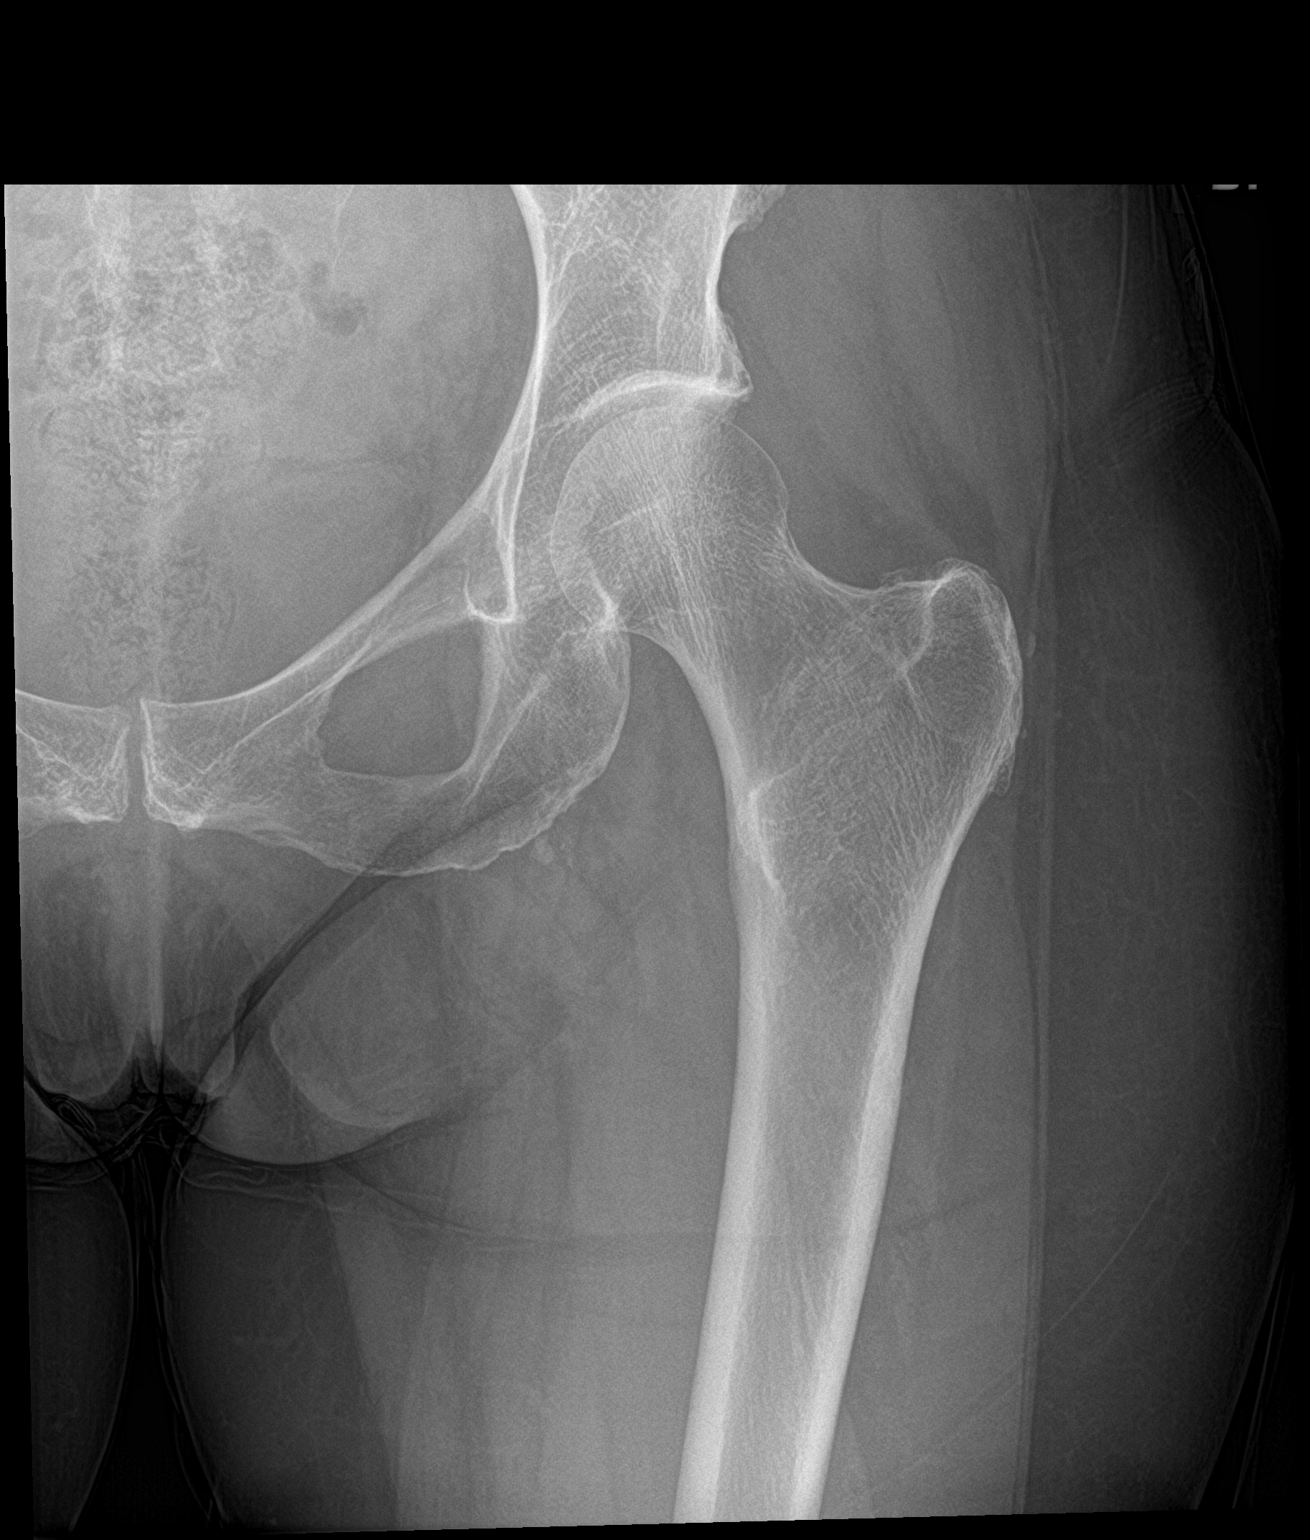

[hip lat]
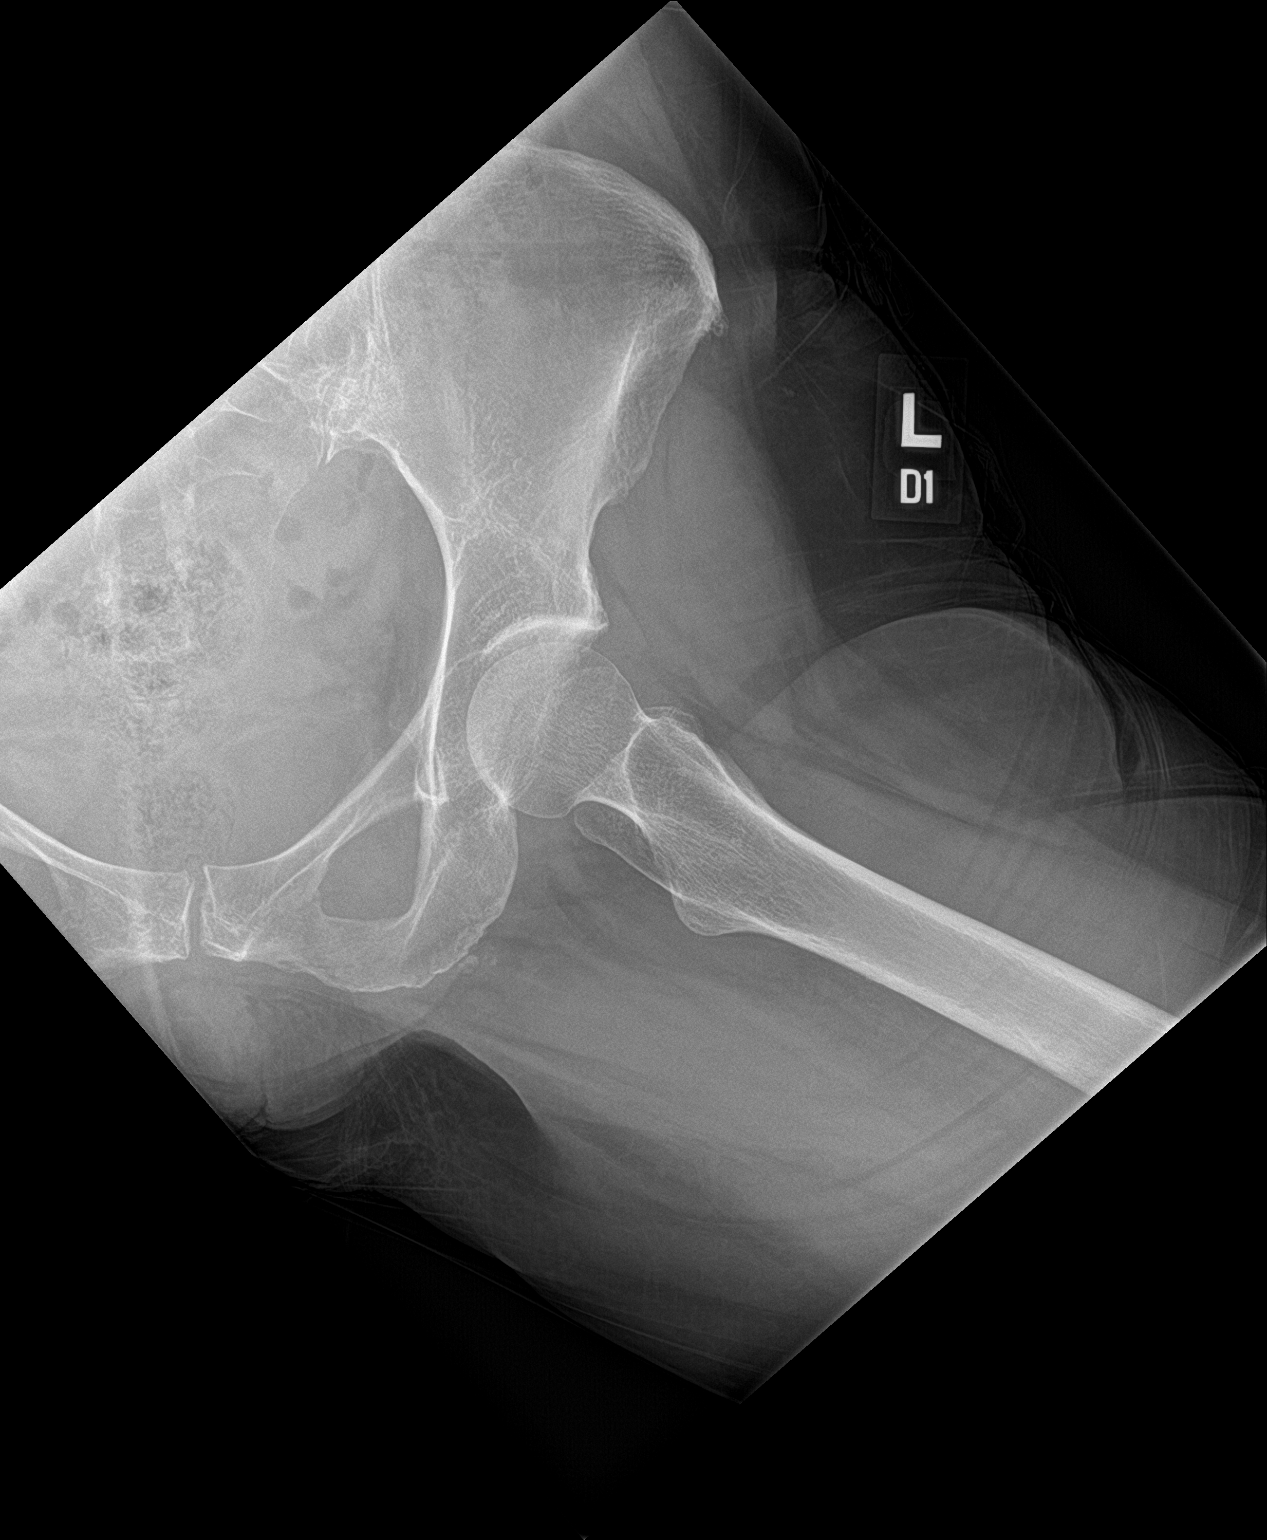

[3 of 3 positions shown; findings below may reference images not displayed]

FINDINGS: No recent fracture or dislocation is seen. There is no significant
joint space narrowing. There are no focal lytic lesions. There are
small linear smooth marginated calcifications in the soft tissues
along the lateral margin of greater trochanter of proximal left
femur. There are small soft tissue calcifications adjacent to the
ischial tuberosity.
IMPRESSION: No recent fracture or dislocation is seen in the left hip. Small
smooth marginated calcifications adjacent to the lateral margin of
greater trochanter of proximal left femur and left ischial
tuberosity may suggest ligament calcifications from previous injury
or calcific bursitis or tendinosis.

## 2022-05-01 ENCOUNTER — Ambulatory Visit: Payer: Managed Care, Other (non HMO) | Admitting: Family Medicine

## 2022-05-01 ENCOUNTER — Telehealth: Payer: Self-pay | Admitting: Family Medicine

## 2022-05-01 VITALS — BP 105/68 | HR 92 | Temp 98.0°F | Resp 16 | Ht 63.0 in | Wt 107.0 lb

## 2022-05-01 DIAGNOSIS — E559 Vitamin D deficiency, unspecified: Secondary | ICD-10-CM

## 2022-05-01 DIAGNOSIS — F419 Anxiety disorder, unspecified: Secondary | ICD-10-CM | POA: Diagnosis not present

## 2022-05-01 DIAGNOSIS — E785 Hyperlipidemia, unspecified: Secondary | ICD-10-CM | POA: Diagnosis not present

## 2022-05-01 DIAGNOSIS — M24159 Other articular cartilage disorders, unspecified hip: Secondary | ICD-10-CM

## 2022-05-01 MED ORDER — ALPRAZOLAM 0.25 MG PO TABS: 0.2500 mg | ORAL_TABLET | Freq: Two times a day (BID) | ORAL | 1 refills | Status: DC | PRN

## 2022-05-01 NOTE — Progress Notes (Signed)
Subjective:   By signing my name below, I, Kellie Simmering, attest that this documentation has been prepared under the direction and in the presence of Mosie Lukes, MD., 05/01/2022.   Patient ID: Catherine Haynes, female    DOB: 12-23-1955, 66 y.o.   MRN: [id number]  No chief complaint on file.  HPI Patient is in today for an office visit.  Anxiety/Depression: She is mentally doing better but is interested in receiving counseling/therapy. She takes Alprazolam 0.25 mg as needed and reports that it is effective at calming her.   Blood Pressure: Her blood pressure is within normal range today. She reports that she has not been taking her Metoprolol 25 mg. BP Readings from Last 3 Encounters:  05/01/22 105/68  10/24/21 118/70  06/15/21 132/80   Immunizations: She is up to date on Covid-19 and Influenza immunizations.  Osteoporosis: She takes calcium 600 mg and magnesium supplements to manage her osteoporosis. She attempts to remain active. 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  Past Medical History:  Diagnosis Date    social 12/29/2014   Abdominal aortic ectasia ([place]) 12/11/2014   Allergy    Anxiety 10/24/2021   Atrial fibrillation ([place]) 12/11/2014   BCC (basal cell carcinoma), face 12/11/2014   Cervical cancer screening 12/05/2015   History of shingles 01/24/2017   Hx: UTI (urinary tract infection) 12/11/2014   Hyperlipidemia    Mitral valve prolapse    Morbid obesity ([place]) 12/11/2014   Osteoporosis 12/11/2014   Paroxysmal atrial fibrillation ([place]) 12/11/2014   Preventative health care 12/05/2015   Vitamin D deficiency 12/29/2014   Past Surgical History:  Procedure Laterality Date   CESAREAN SECTION     TONSILLECTOMY     Family History  Problem Relation Age of Onset   Stroke Mother        TIA   Hypertension Mother    Hyperlipidemia Mother    Diabetes Mother        diet controlled   Osteoporosis Mother    Stroke Father    Hypertension  Father    Diabetes Father    Hyperlipidemia Father    Mental illness Sister 52       suicide   Heart disease Maternal Grandmother        chf   Osteoporosis Maternal Grandmother    Alcohol abuse Maternal Grandfather    Cancer Paternal Grandmother    Diabetes Paternal Grandmother    Heart disease Paternal Grandfather    Social History   Socioeconomic History   Marital status: Married    Spouse name: Not on file   Number of children: Not on file   Years of education: Not on file   Highest education level: Not on file  Occupational History   Occupation: clean houses   Tobacco Use   Smoking status: Never   Smokeless tobacco: Never  Vaping Use   Vaping Use: Never used  Substance and Sexual Activity   Alcohol use: Yes    Alcohol/week: 0.0 standard drinks of alcohol    Comment: social.   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 20 tablet 1   Calcium Carbonate (CALCIUM 600 PO) Take 1 tablet by mouth 2 (two) times daily.     Cholecalciferol (VITAMIN D) 50 MCG (2000 UT) tablet Take 2,000 Units by mouth daily.     FEXOFENADINE HCL PO Take by mouth.     meloxicam (MOBIC) 7.5 MG tablet Take 1 tablet (7.5 mg total) by mouth daily. 14 tablet 0   metoprolol succinate (TOPROL-XL) 25 MG 24 hr tablet Take 0.5-1 tablets (12.5-25 mg total) by mouth daily. 30 tablet 3   Omega-3 Fatty  Acids (FISH OIL) 1000 MG CPDR Take 1 capsule by mouth daily.     No facility-administered medications prior to visit.   No Known Allergies  ROS     Objective:    Physical Exam Constitutional:      General: She is not in acute distress.    Appearance: Normal appearance. She is not ill-appearing.  HENT:     Head: Normocephalic and atraumatic.     Right Ear: External ear normal.     Left Ear: External ear normal.     Mouth/Throat:     Mouth: Mucous membranes are moist.     Pharynx: Oropharynx is clear.  Eyes:     Extraocular Movements: Extraocular movements intact.     Pupils: Pupils are equal, round, and reactive to light.  Cardiovascular:     Rate and Rhythm: Normal rate and regular rhythm.     Pulses: Normal pulses.     Heart sounds: Normal heart sounds. No murmur heard.    No gallop.  Pulmonary:     Effort: Pulmonary effort is normal. No respiratory distress.     Breath sounds: Normal breath sounds. No wheezing or rales.  Abdominal:     General: Bowel sounds are normal.  Skin:    General: Skin is warm and dry.  Neurological:     Mental Status: She is alert and oriented to person, place, and time.  Psychiatric:        Mood and Affect: Mood normal.        Behavior: Behavior normal.        Judgment: Judgment normal.    There were no vitals taken for this visit. Wt Readings from Last 3 Encounters:  10/24/21 107 lb 6.4 oz (48.7 kg)  06/15/21 109 lb 9.6 oz (49.7 kg)  06/13/21 114 lb (51.7 kg)   Diabetic Foot Exam - Simple   No data filed    Lab Results  Component Value Date   WBC 4.8 10/17/2021   HGB 13.4 10/17/2021   HCT 40.8 10/17/2021   PLT 246.0 10/17/2021   GLUCOSE 87 10/17/2021   CHOL 201 (H) 10/17/2021   TRIG 57.0 10/17/2021   HDL 78.20 10/17/2021   LDLCALC 112 (H) 10/17/2021   ALT 16 10/17/2021   AST 20 10/17/2021   NA 139 10/17/2021   K 4.1 10/17/2021   CL 104 10/17/2021   CREATININE 0.61 10/17/2021   BUN 9 10/17/2021   CO2 26 10/17/2021    TSH 3.32 10/17/2021   HGBA1C 5.7 10/17/2021   Lab Results  Component Value Date   TSH 3.32 10/17/2021  Lab Results  Component Value Date   WBC 4.8 10/17/2021   HGB 13.4 10/17/2021   HCT 40.8 10/17/2021   MCV 93.0 10/17/2021   PLT 246.0 10/17/2021   Lab Results  Component Value Date   NA 139 10/17/2021   K 4.1 10/17/2021   CO2 26 10/17/2021   GLUCOSE 87 10/17/2021   BUN 9 10/17/2021   CREATININE 0.61 10/17/2021   BILITOT 0.7 10/17/2021   ALKPHOS 58 10/17/2021   AST 20 10/17/2021   ALT 16 10/17/2021   PROT 6.5 10/17/2021   ALBUMIN 4.5 10/17/2021   CALCIUM 9.1 10/17/2021   GFR 95.12 10/17/2021   Lab Results  Component Value Date   CHOL 201 (H) 10/17/2021   Lab Results  Component Value Date   HDL 78.20 10/17/2021   Lab Results  Component Value Date   LDLCALC 112 (H) 10/17/2021   Lab Results  Component Value Date   TRIG 57.0 10/17/2021   Lab Results  Component Value Date   CHOLHDL 3 10/17/2021   Lab Results  Component Value Date   HGBA1C 5.7 10/17/2021      Assessment & Plan:   Problem List Items Addressed This Visit   None  No orders of the defined types were placed in this encounter.  I, Kellie Simmering, personally preformed the services described in this documentation.  All medical record entries made by the scribe were at my direction and in my presence.  I have reviewed the chart and discharge instructions (if applicable) and agree that the record reflects my personal performance and is accurate and complete. 05/01/2022  I,Mohammed Iqbal,acting as a scribe for Penni Homans, MD.,have documented all relevant documentation on the behalf of Penni Homans, MD,as directed by  Penni Homans, MD while in the presence of Penni Homans, MD.  Kellie Simmering

## 2022-05-01 NOTE — Telephone Encounter (Signed)
Patient is requesting to come in for CPE labwork before her CPE appt on 11/12/22. Please advise.

## 2022-05-01 NOTE — Patient Instructions (Addendum)
Recommend calcium intake of 1200 to 1500 mg daily, divided into roughly 3 doses. Best source is the diet and a single dairy serving is about 500 mg, a supplement of calcium citrate once or twice daily to balance diet is fine if not getting enough in diet. Also need Vitamin D 2000 IU caps, 1 cap daily if not already taking vitamin D. Also recommend weight baring exercise on hips and upper body to keep bones strong   [facility] for [place] on [place] for hip pain

## 2022-05-02 ENCOUNTER — Other Ambulatory Visit: Payer: Self-pay

## 2022-05-02 DIAGNOSIS — E559 Vitamin D deficiency, unspecified: Secondary | ICD-10-CM

## 2022-05-02 DIAGNOSIS — E785 Hyperlipidemia, unspecified: Secondary | ICD-10-CM

## 2022-05-02 DIAGNOSIS — R7989 Other specified abnormal findings of blood chemistry: Secondary | ICD-10-CM

## 2022-05-02 DIAGNOSIS — R739 Hyperglycemia, unspecified: Secondary | ICD-10-CM

## 2022-05-02 DIAGNOSIS — Z0000 Encounter for general adult medical examination without abnormal findings: Secondary | ICD-10-CM

## 2022-05-02 NOTE — Assessment & Plan Note (Signed)
Encourage heart healthy diet such as MIND or DASH diet, increase exercise, avoid trans fats, simple carbohydrates and processed foods, consider a krill or fish or flaxseed oil cap daily.  °

## 2022-05-02 NOTE — Assessment & Plan Note (Addendum)
her hip is doing much better. Stay active and let us know if worsens.

## 2022-05-02 NOTE — Assessment & Plan Note (Signed)
She is doing much better and has had good results when she has neede Alprazolam intermittently. She is going to pursue counseling as well

## 2022-05-02 NOTE — Assessment & Plan Note (Signed)
Supplement and monitor 

## 2022-05-02 NOTE — Telephone Encounter (Signed)
Labs ordered and appt was made

## 2022-05-16 ENCOUNTER — Telehealth: Payer: Managed Care, Other (non HMO) | Admitting: Nurse Practitioner

## 2022-05-16 DIAGNOSIS — U071 COVID-19: Secondary | ICD-10-CM | POA: Diagnosis not present

## 2022-05-16 MED ORDER — NIRMATRELVIR/RITONAVIR (PAXLOVID)TABLET
3.0000 | ORAL_TABLET | Freq: Two times a day (BID) | ORAL | 0 refills | Status: AC
Start: 2022-05-16 — End: 2022-05-21

## 2022-05-16 NOTE — Patient Instructions (Signed)
Catherine Haynes, thank you for joining Gildardo Pounds, NP for today's virtual visit.  While this provider is not your primary care provider (PCP), if your PCP is located in our provider database this encounter information will be shared with them immediately following your visit.   [facility] account gives you access to today's visit and all your visits, tests, and labs performed at [facility] " click here if you don't have a [facility] account or go to [email or web address]  Consent: (Patient) Catherine Haynes provided verbal consent for this virtual visit at the beginning of the encounter.  Current Medications:  Current Outpatient Medications:    nirmatrelvir/ritonavir EUA (PAXLOVID) 20 x 150 MG & 10 x '100MG'$  TABS, Take 3 tablets by mouth 2 (two) times daily for 5 days. (Take nirmatrelvir 150 mg two tablets twice daily for 5 days and ritonavir 100 mg one tablet twice daily for 5 days) Patient GFR is 0.61, Disp: 30 tablet, Rfl: 0   ALPRAZolam (XANAX) 0.25 MG tablet, Take 1 tablet (0.25 mg total) by mouth 2 (two) times daily as needed for anxiety., Disp: 20 tablet, Rfl: 1   Calcium Carbonate (CALCIUM 600 PO), Take 1 tablet by mouth 2 (two) times daily., Disp: , Rfl:    Cholecalciferol (VITAMIN D) 50 MCG (2000 UT) tablet, Take 2,000 Units by mouth daily., Disp: , Rfl:    FEXOFENADINE HCL PO, Take by mouth., Disp: , Rfl:    magnesium (MAGTAB) 84 MG (7MEQ) TBCR SR tablet, Take 84 mg by mouth., Disp: , Rfl:    meloxicam (MOBIC) 7.5 MG tablet, Take 1 tablet (7.5 mg total) by mouth daily., Disp: 14 tablet, Rfl: 0   metoprolol succinate (TOPROL-XL) 25 MG 24 hr tablet, Take 0.5-1 tablets (12.5-25 mg total) by mouth daily., Disp: 30 tablet, Rfl: 3   Omega-3 Fatty Acids (FISH OIL) 1000 MG CPDR, Take 1 capsule by mouth daily., Disp: , Rfl:    Medications ordered in this encounter:  Meds ordered this encounter  Medications   nirmatrelvir/ritonavir EUA (PAXLOVID) 20 x  150 MG & 10 x '100MG'$  TABS    Sig: Take 3 tablets by mouth 2 (two) times daily for 5 days. (Take nirmatrelvir 150 mg two tablets twice daily for 5 days and ritonavir 100 mg one tablet twice daily for 5 days) Patient GFR is 0.61    Dispense:  30 tablet    Refill:  0    Order Specific Question:   Supervising Provider    Answer:   Chase Picket [id number]     *If you need refills on other medications prior to your next appointment, please contact your pharmacy*  Follow-Up: Call back or seek an in-person evaluation if the symptoms worsen or if the condition fails to improve as anticipated.  [facility] [phone]  Other Instructions  Please keep well-hydrated and get plenty of rest. Start a saline nasal rinse to flush out your nasal passages. You can use plain Mucinex to help thin congestion. If you have a humidifier, you can use this daily as needed.    You are to wear a mask for 5 days from onset of your symptoms.  After day 5, if you have had no fever and you are feeling better with NO symptoms, you can end masking. Keep in mind you can be contagious 10 days from the onset of symptoms  After day 5 if you have a fever or are having significant symptoms, please wear your  mask for full 10 days.   If you note any worsening of symptoms, any significant shortness of breath or any chest pain, please seek ER evaluation ASAP.  Please do not delay care!    If you note any worsening of symptoms, any significant shortness of breath or any chest pain, please seek ER evaluation ASAP.  Please do not delay care!    If you have been instructed to have an in-person evaluation today at a local Urgent Care facility, please use the link below. It will take you to a list of all of our available [facility], including address, phone number and hours of operation. Please do not delay care.  [facility]  If you or a family member do not have a primary care  provider, use the link below to schedule a visit and establish care. When you choose a [facility] primary care physician or advanced practice provider, you gain a long-term partner in health. Find a Primary Care Provider  Learn more about [facility]'s in-office and virtual care options: [facility]

## 2022-05-16 NOTE — Progress Notes (Signed)
Virtual Visit Consent   Catherine Haynes, you are scheduled for a virtual visit with a [facility] provider today. Just as with appointments in the office, your consent must be obtained to participate. Your consent will be active for this visit and any virtual visit you may have with one of our providers in the next 365 days. If you have a MyChart account, a copy of this consent can be sent to you electronically.  As this is a virtual visit, video technology does not allow for your provider to perform a traditional examination. This may limit your provider's ability to fully assess your condition. If your provider identifies any concerns that need to be evaluated in person or the need to arrange testing (such as labs, EKG, etc.), we will make arrangements to do so. Although advances in technology are sophisticated, we cannot ensure that it will always work on either your end or our end. If the connection with a video visit is poor, the visit may have to be switched to a telephone visit. With either a video or telephone visit, we are not always able to ensure that we have a secure connection.  By engaging in this virtual visit, you consent to the provision of healthcare and authorize for your insurance to be billed (if applicable) for the services provided during this visit. Depending on your insurance coverage, you may receive a charge related to this service.  I need to obtain your verbal consent now. Are you willing to proceed with your visit today? Catherine Haynes has provided verbal consent on 05/16/2022 for a virtual visit (video or telephone). Catherine Pounds, NP  Date: 05/16/2022 6:30 PM  Virtual Visit via Video Note   I, Catherine Haynes, connected with  Catherine Haynes  ([id number], February 16, 1961) on 05/16/22 at  6:30 PM EST by a video-enabled telemedicine application and verified that I am speaking with the correct person using two identifiers.  Location: Patient: Virtual Visit Location Patient:  Home Provider: Virtual Visit Location Provider: Home Office   I discussed the limitations of evaluation and management by telemedicine and the availability of in person appointments. The patient expressed understanding and agreed to proceed.    History of Present Illness: Catherine Haynes is a 66 y.o. who identifies as a female who was assigned female at birth, and is being seen today for Positive home covid test.   Tested positive for COVID after taking a test at work tonight (she works at a nursing home).  Currently experiencing the following symptoms:  Chest feels uncomfortable, Headache, dry hacking cough and fever. Denies chest pain or worsening shortness of breath.    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   Left hip pain 05/09/2021   Elevated blood pressure reading 05/09/2021   Abnormal TSH 09/28/2019   Lower back injury, initial encounter 02/01/2017   History of shingles 01/24/2017   Cervical cancer screening 12/05/2015   Preventative health care 12/05/2015   Vitamin D deficiency 12/29/2014   BCC (basal cell carcinoma), face 12/11/2014   Hx: UTI (urinary tract infection) 12/11/2014   Osteoporosis 12/11/2014   Colon cancer screening 12/11/2014   Hyperlipidemia    Allergy    Mitral valve prolapse     Allergies: No Known Allergies Medications:  Current Outpatient Medications:    nirmatrelvir/ritonavir EUA (PAXLOVID) 20 x 150 MG & 10 x '100MG'$  TABS, Take 3 tablets by mouth 2 (  two) times daily for 5 days. (Take nirmatrelvir 150 mg two tablets twice daily for 5 days and ritonavir 100 mg one tablet twice daily for 5 days) Patient GFR is 0.61, Disp: 30 tablet, Rfl: 0   ALPRAZolam (XANAX) 0.25 MG tablet, Take 1 tablet (0.25 mg total) by mouth 2 (two) times daily as needed for anxiety., Disp: 20 tablet, Rfl: 1   Calcium Carbonate (CALCIUM 600 PO), Take 1 tablet by mouth 2 (two) times daily.,  Disp: , Rfl:    Cholecalciferol (VITAMIN D) 50 MCG (2000 UT) tablet, Take 2,000 Units by mouth daily., Disp: , Rfl:    FEXOFENADINE HCL PO, Take by mouth., Disp: , Rfl:    magnesium (MAGTAB) 84 MG (7MEQ) TBCR SR tablet, Take 84 mg by mouth., Disp: , Rfl:    meloxicam (MOBIC) 7.5 MG tablet, Take 1 tablet (7.5 mg total) by mouth daily., Disp: 14 tablet, Rfl: 0   metoprolol succinate (TOPROL-XL) 25 MG 24 hr tablet, Take 0.5-1 tablets (12.5-25 mg total) by mouth daily., Disp: 30 tablet, Rfl: 3   Omega-3 Fatty Acids (FISH OIL) 1000 MG CPDR, Take 1 capsule by mouth daily., Disp: , Rfl:   Observations/Objective: Patient is well-developed, well-nourished in no acute distress.  Resting comfortably  at home.  Head is normocephalic, atraumatic.  No labored breathing.  Speech is clear and coherent with logical content.  Patient is alert and oriented at baseline.    Assessment and Plan: 1. Positive self-administered antigen test for COVID-19 - nirmatrelvir/ritonavir EUA (PAXLOVID) 20 x 150 MG & 10 x '100MG'$  TABS; Take 3 tablets by mouth 2 (two) times daily for 5 days. (Take nirmatrelvir 150 mg two tablets twice daily for 5 days and ritonavir 100 mg one tablet twice daily for 5 days) Patient GFR is 0.61  Dispense: 30 tablet; Refill: 0   Please keep well-hydrated and get plenty of rest. Start a saline nasal rinse to flush out your nasal passages. You can use plain Mucinex to help thin congestion. If you have a humidifier, you can use this daily as needed.    You are to wear a mask for 5 days from onset of your symptoms.  After day 5, if you have had no fever and you are feeling better with NO symptoms, you can end masking. Keep in mind you can be contagious 10 days from the onset of symptoms  After day 5 if you have a fever or are having significant symptoms, please wear your mask for full 10 days.   If you note any worsening of symptoms, any significant shortness of breath or any chest pain, please  seek ER evaluation ASAP.  Please do not delay care!    If you note any worsening of symptoms, any significant shortness of breath or any chest pain, please seek ER evaluation ASAP.  Please do not delay care!   Follow Up Instructions: I discussed the assessment and treatment plan with the patient. The patient was provided an opportunity to ask questions and all were answered. The patient agreed with the plan and demonstrated an understanding of the instructions.  A copy of instructions were sent to the patient via MyChart unless otherwise noted below.    The patient was advised to call back or seek an in-person evaluation if the symptoms worsen or if the condition fails to improve as anticipated.  Time:  I spent 11 minutes with the patient via telehealth technology discussing the above problems/concerns.    Catherine Pounds, NP

## 2022-10-08 ENCOUNTER — Encounter: Payer: Self-pay | Admitting: *Deleted

## 2022-11-06 ENCOUNTER — Other Ambulatory Visit (INDEPENDENT_AMBULATORY_CARE_PROVIDER_SITE_OTHER): Payer: Managed Care, Other (non HMO)

## 2022-11-06 DIAGNOSIS — Z0000 Encounter for general adult medical examination without abnormal findings: Secondary | ICD-10-CM

## 2022-11-06 DIAGNOSIS — E559 Vitamin D deficiency, unspecified: Secondary | ICD-10-CM | POA: Diagnosis not present

## 2022-11-06 DIAGNOSIS — R739 Hyperglycemia, unspecified: Secondary | ICD-10-CM

## 2022-11-06 DIAGNOSIS — R7989 Other specified abnormal findings of blood chemistry: Secondary | ICD-10-CM

## 2022-11-06 LAB — CBC WITH DIFFERENTIAL/PLATELET
Basophils Absolute: 0.1 10*3/uL (ref 0.0–0.1)
Basophils Relative: 1.2 % (ref 0.0–3.0)
Eosinophils Absolute: 0.3 10*3/uL (ref 0.0–0.7)
Eosinophils Relative: 7.1 % — ABNORMAL HIGH (ref 0.0–5.0)
HCT: 40.9 % (ref 36.0–46.0)
Hemoglobin: 13.6 g/dL (ref 12.0–15.0)
Lymphocytes Relative: 22.3 % (ref 12.0–46.0)
Lymphs Abs: 1 10*3/uL (ref 0.7–4.0)
MCHC: 33.2 g/dL (ref 30.0–36.0)
MCV: 90.9 fl (ref 78.0–100.0)
Monocytes Absolute: 0.4 10*3/uL (ref 0.1–1.0)
Monocytes Relative: 9.6 % (ref 3.0–12.0)
Neutro Abs: 2.8 10*3/uL (ref 1.4–7.7)
Neutrophils Relative %: 59.8 % (ref 43.0–77.0)
Platelets: 231 10*3/uL (ref 150.0–400.0)
RBC: 4.5 Mil/uL (ref 3.87–5.11)
RDW: 12.9 % (ref 11.5–15.5)
WBC: 4.7 10*3/uL (ref 4.0–10.5)

## 2022-11-06 LAB — COMPREHENSIVE METABOLIC PANEL
ALT: 15 U/L (ref 0–35)
AST: 19 U/L (ref 0–37)
Albumin: 4.2 g/dL (ref 3.5–5.2)
Alkaline Phosphatase: 65 U/L (ref 39–117)
BUN: 13 mg/dL (ref 6–23)
CO2: 29 mEq/L (ref 19–32)
Calcium: 9.1 mg/dL (ref 8.4–10.5)
Chloride: 104 mEq/L (ref 96–112)
Creatinine, Ser: 0.66 mg/dL (ref 0.40–1.20)
GFR: 92.64 mL/min (ref >60.00)
Glucose, Bld: 83 mg/dL (ref 70–99)
Potassium: 4.5 mEq/L (ref 3.5–5.1)
Sodium: 140 mEq/L (ref 135–145)
Total Bilirubin: 0.5 mg/dL (ref 0.2–1.2)
Total Protein: 6.3 g/dL (ref 6.0–8.3)

## 2022-11-06 LAB — LIPID PANEL
Cholesterol: 205 mg/dL — ABNORMAL HIGH (ref 0–200)
HDL: 74.9 mg/dL (ref >39.00)
LDL Cholesterol: 118 mg/dL — ABNORMAL HIGH (ref 0–99)
NonHDL: 129.84
Total CHOL/HDL Ratio: 3
Triglycerides: 59 mg/dL (ref 0.0–149.0)
VLDL: 11.8 mg/dL (ref 0.0–40.0)

## 2022-11-06 LAB — HEMOGLOBIN A1C: Hgb A1c MFr Bld: 5.8 % (ref 4.6–6.5)

## 2022-11-06 LAB — TSH: TSH: 2.53 u[IU]/mL (ref 0.35–5.50)

## 2022-11-06 LAB — VITAMIN D 25 HYDROXY (VIT D DEFICIENCY, FRACTURES): VITD: 30.05 ng/mL (ref 30.00–100.00)

## 2022-11-11 NOTE — Assessment & Plan Note (Signed)
Supplement and monitor was normal but low normal continue 2000 IU daily but twice a week take 4000 IU

## 2022-11-11 NOTE — Assessment & Plan Note (Signed)
Uses Alprazolam sparingly with adequate results

## 2022-11-11 NOTE — Assessment & Plan Note (Signed)
Encourage heart healthy diet such as MIND or DASH diet, increase exercise, avoid trans fats, simple carbohydrates and processed foods, consider a krill or fish or flaxseed oil cap daily.  °

## 2022-11-11 NOTE — Assessment & Plan Note (Signed)
Encouraged to get adequate exercise, calcium and vitamin d intake. Discussed at length taking Fosamax or Prolia but patient declines for now

## 2022-11-12 ENCOUNTER — Encounter: Payer: Self-pay | Admitting: Family Medicine

## 2022-11-12 ENCOUNTER — Ambulatory Visit (INDEPENDENT_AMBULATORY_CARE_PROVIDER_SITE_OTHER): Payer: Managed Care, Other (non HMO) | Admitting: Family Medicine

## 2022-11-12 VITALS — BP 105/78 | HR 81 | Temp 97.7°F | Resp 16 | Ht 63.0 in | Wt 111.4 lb

## 2022-11-12 DIAGNOSIS — E785 Hyperlipidemia, unspecified: Secondary | ICD-10-CM | POA: Diagnosis not present

## 2022-11-12 DIAGNOSIS — M810 Age-related osteoporosis without current pathological fracture: Secondary | ICD-10-CM | POA: Diagnosis not present

## 2022-11-12 DIAGNOSIS — Z0000 Encounter for general adult medical examination without abnormal findings: Secondary | ICD-10-CM | POA: Diagnosis not present

## 2022-11-12 DIAGNOSIS — D721 Eosinophilia, unspecified: Secondary | ICD-10-CM

## 2022-11-12 DIAGNOSIS — E559 Vitamin D deficiency, unspecified: Secondary | ICD-10-CM

## 2022-11-12 DIAGNOSIS — Z1231 Encounter for screening mammogram for malignant neoplasm of breast: Secondary | ICD-10-CM

## 2022-11-12 DIAGNOSIS — R739 Hyperglycemia, unspecified: Secondary | ICD-10-CM

## 2022-11-12 DIAGNOSIS — R7989 Other specified abnormal findings of blood chemistry: Secondary | ICD-10-CM

## 2022-11-12 DIAGNOSIS — F419 Anxiety disorder, unspecified: Secondary | ICD-10-CM

## 2022-11-12 NOTE — Patient Instructions (Signed)
Preventive Care 65-64 Years Old, Female Preventive care refers to lifestyle choices and visits with your health care provider that can promote health and wellness. Preventive care visits are also called wellness exams. What can I expect for my preventive care visit? Counseling Your health care provider may ask you questions about your: Medical history, including: Past medical problems. Family medical history. Pregnancy history. Current health, including: Menstrual cycle. Method of birth control. Emotional well-being. Home life and relationship well-being. Sexual activity and sexual health. Lifestyle, including: Alcohol, nicotine or tobacco, and drug use. Access to firearms. Diet, exercise, and sleep habits. Work and work environment. Sunscreen use. Safety issues such as seatbelt and bike helmet use. Physical exam Your health care provider will check your: Height and weight. These may be used to calculate your BMI (body mass index). BMI is a measurement that tells if you are at a healthy weight. Waist circumference. This measures the distance around your waistline. This measurement also tells if you are at a healthy weight and may help predict your risk of certain diseases, such as type 2 diabetes and high blood pressure. Heart rate and blood pressure. Body temperature. Skin for abnormal spots. What immunizations do I need?  Vaccines are usually given at various ages, according to a schedule. Your health care provider will recommend vaccines for you based on your age, medical history, and lifestyle or other factors, such as travel or where you work. What tests do I need? Screening Your health care provider may recommend screening tests for certain conditions. This may include: Lipid and cholesterol levels. Diabetes screening. This is done by checking your blood sugar (glucose) after you have not eaten for a while (fasting). Pelvic exam and Pap test. Hepatitis B test. Hepatitis C  test. HIV (human immunodeficiency virus) test. STI (sexually transmitted infection) testing, if you are at risk. Lung cancer screening. Colorectal cancer screening. Mammogram. Talk with your health care provider about when you should start having regular mammograms. This may depend on whether you have a family history of breast cancer. BRCA-related cancer screening. This may be done if you have a family history of breast, ovarian, tubal, or peritoneal cancers. Bone density scan. This is done to screen for osteoporosis. Talk with your health care provider about your test results, treatment options, and if necessary, the need for more tests. Follow these instructions at home: Eating and drinking  Eat a diet that includes fresh fruits and vegetables, whole grains, lean protein, and low-fat dairy products. Take vitamin and mineral supplements as recommended by your health care provider. Do not drink alcohol if: Your health care provider tells you not to drink. You are pregnant, may be pregnant, or are planning to become pregnant. If you drink alcohol: Limit how much you have to 0-1 drink a day. Know how much alcohol is in your drink. In the [place] one drink equals one 12 oz bottle of beer (355 mL), one 5 oz glass of wine (148 mL), or one 1 oz glass of hard liquor (44 mL). Lifestyle Brush your teeth every morning and night with fluoride toothpaste. Floss one time each day. Exercise for at least 30 minutes 5 or more days each week. Do not use any products that contain nicotine or tobacco. These products include cigarettes, chewing tobacco, and vaping devices, such as e-cigarettes. If you need help quitting, ask your health care provider. Do not use drugs. If you are sexually active, practice safe sex. Use a condom or other form of protection to   prevent STIs. If you do not wish to become pregnant, use a form of birth control. If you plan to become pregnant, see your health care provider for a  prepregnancy visit. Take aspirin only as told by your health care provider. Make sure that you understand how much to take and what form to take. Work with your health care provider to find out whether it is safe and beneficial for you to take aspirin daily. Find healthy ways to manage stress, such as: Meditation, yoga, or listening to music. Journaling. Talking to a trusted person. Spending time with friends and family. Minimize exposure to UV radiation to reduce your risk of skin cancer. Safety Always wear your seat belt while driving or riding in a vehicle. Do not drive: If you have been drinking alcohol. Do not ride with someone who has been drinking. When you are tired or distracted. While texting. If you have been using any mind-altering substances or drugs. Wear a helmet and other protective equipment during sports activities. If you have firearms in your house, make sure you follow all gun safety procedures. Seek help if you have been physically or sexually abused. What's next? Visit your health care provider once a year for an annual wellness visit. Ask your health care provider how often you should have your eyes and teeth checked. Stay up to date on all vaccines. This information is not intended to replace advice given to you by your health care provider. Make sure you discuss any questions you have with your health care provider. Document Revised: 12/07/2020 Document Reviewed: 12/07/2020 [employer] Patient Education  2023 [employer]  

## 2022-11-12 NOTE — Progress Notes (Unsigned)
Subjective:    Patient ID: Catherine Haynes, female    DOB: [birth year], 67 y.o.   MRN: [id number]  Chief Complaint  Patient presents with   Annual Exam    Annual Exam     HPI Patient is in today for follow up on chronic medical concerns and annual preventative exam. No recent febrile illness or hospitalizations. Denies CP/palp/SOB/HA/congestion/fevers/GI or GU c/o. Taking meds as prescribed. She continues to exercise regularly, maintains a heart healthy diet. She is encouraged to hydrate well throughout the day.   Past Medical History:  Diagnosis Date    social 12/29/2014   Abdominal aortic ectasia (HCC) 12/11/2014   Allergy    Anxiety 10/24/2021   Atrial fibrillation (HCC) 12/11/2014   BCC (basal cell carcinoma), face 12/11/2014   Cervical cancer screening 12/05/2015   History of shingles 01/24/2017   Hx: UTI (urinary tract infection) 12/11/2014   Hyperlipidemia    Mitral valve prolapse    Morbid obesity (HCC) 12/11/2014   Osteoporosis 12/11/2014   Paroxysmal atrial fibrillation (HCC) 12/11/2014   Preventative health care 12/05/2015   Vitamin D deficiency 12/29/2014    Past Surgical History:  Procedure Laterality Date   CESAREAN SECTION     TONSILLECTOMY      Family History  Problem Relation Age of Onset   Stroke Mother        TIA   Hypertension Mother    Hyperlipidemia Mother    Diabetes Mother        diet controlled   Osteoporosis Mother    Stroke Father    Hypertension Father    Diabetes Father    Hyperlipidemia Father    Mental illness Sister 30       suicide   Heart disease Maternal Grandmother        chf   Osteoporosis Maternal Grandmother    Alcohol abuse Maternal Grandfather    Cancer Paternal Grandmother    Diabetes Paternal Grandmother    Heart disease Paternal Grandfather     Social History   Socioeconomic History   Marital status: Married    Spouse name: Not on file   Number of children: Not on file   Years of education: Not on file   Highest  education level: Not on file  Occupational History   Occupation: clean houses   Tobacco Use   Smoking status: Never   Smokeless tobacco: Never  Vaping Use   Vaping Use: Never used  Substance and Sexual Activity   Alcohol use: Yes    Alcohol/week: 0.0 standard drinks of alcohol    Comment: social.   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, house cleaner, no dietary restrictions  Other Topics Concern   Not on file  Social History Narrative   Not on file   Social Determinants of Health   Financial Resource Strain: Not on file  Food Insecurity: Not on file  Transportation Needs: Not on file  Physical Activity: Not on file  Stress: Not on file  Social Connections: Not on file  Intimate Partner Violence: Not on file    Outpatient Medications Prior to Visit  Medication Sig Dispense Refill   ALPRAZolam (XANAX) 0.25 MG tablet Take 1 tablet (0.25 mg total) by mouth 2 (two) times daily as needed for anxiety. 20 tablet 1   Calcium Carbonate (CALCIUM 600 PO) Take 1 tablet by mouth 2 (two) times daily.     Cholecalciferol (VITAMIN D) 50  MCG (2000 UT) tablet Take 2,000 Units by mouth daily.     FEXOFENADINE HCL PO Take by mouth.     magnesium (MAGTAB) 84 MG ( ) TBCR SR tablet Take 84 mg by mouth.     Omega-3 Fatty Acids (FISH OIL) 1000 MG CPDR Take 1 capsule by mouth daily.     meloxicam (MOBIC) 7.5 MG tablet Take 1 tablet (7.5 mg total) by mouth daily. 14 tablet 0   metoprolol succinate (TOPROL-XL) 25 MG 24 hr tablet Take 0.5-1 tablets (12.5-25 mg total) by mouth daily. 30 tablet 3   No facility-administered medications prior to visit.    No Known Allergies  Review of Systems  Constitutional:  Negative for chills, fever and malaise/fatigue.  HENT:  Negative for congestion and hearing loss.   Eyes:  Negative for discharge.  Respiratory:  Negative for cough, sputum production and shortness of breath.   Cardiovascular:  Negative for chest  pain, palpitations and leg swelling.  Gastrointestinal:  Negative for abdominal pain, blood in stool, constipation, diarrhea, heartburn, nausea and vomiting.  Genitourinary:  Negative for dysuria, frequency, hematuria and urgency.  Musculoskeletal:  Negative for back pain, falls and myalgias.  Skin:  Negative for rash.  Neurological:  Negative for dizziness, sensory change, loss of consciousness, weakness and headaches.  Endo/Heme/Allergies:  Negative for environmental allergies. Does not bruise/bleed easily.  Psychiatric/Behavioral:  Negative for depression and suicidal ideas. The patient is not nervous/anxious and does not have insomnia.        Objective:    Physical Exam Constitutional:      General: She is not in acute distress.    Appearance: Normal appearance. She is not diaphoretic.  HENT:     Head: Normocephalic and atraumatic.     Right Ear: Tympanic membrane, ear canal and external ear normal.     Left Ear: Tympanic membrane, ear canal and external ear normal.     Nose: Nose normal.     Mouth/Throat:     Mouth: Mucous membranes are moist.     Pharynx: Oropharynx is clear. No oropharyngeal exudate.  Eyes:     General: No scleral icterus.       Right eye: No discharge.        Left eye: No discharge.     Conjunctiva/sclera: Conjunctivae normal.     Pupils: Pupils are equal, round, and reactive to light.  Neck:     Thyroid: No thyromegaly.  Cardiovascular:     Rate and Rhythm: Normal rate and regular rhythm.     Heart sounds: Normal heart sounds. No murmur heard. Pulmonary:     Effort: Pulmonary effort is normal. No respiratory distress.     Breath sounds: Normal breath sounds. No wheezing or rales.  Abdominal:     General: Bowel sounds are normal. There is no distension.     Palpations: Abdomen is soft. There is no mass.     Tenderness: There is no abdominal tenderness.  Musculoskeletal:        General: No tenderness. Normal range of motion.     Cervical back:  Normal range of motion and neck supple.  Lymphadenopathy:     Cervical: No cervical adenopathy.  Skin:    General: Skin is warm and dry.     Findings: No rash.  Neurological:     General: No focal deficit present.     Mental Status: She is alert and oriented to person, place, and time.     Cranial Nerves: No cranial  nerve deficit.     Coordination: Coordination normal.     Deep Tendon Reflexes: Reflexes are normal and symmetric. Reflexes normal.  Psychiatric:        Mood and Affect: Mood normal.        Behavior: Behavior normal.        Thought Content: Thought content normal.        Judgment: Judgment normal.     BP 105/78 (BP Location: Right Arm, Patient Position: Sitting, Cuff Size: Normal)   Pulse 81   Temp 97.7 F (36.5 C) (Oral)   Resp 16   Ht 5\' 3"  (1.6 m)   Wt 111 lb 6.4 oz (50.5 kg)   SpO2 99%   BMI 19.73 kg/m  Wt Readings from Last 3 Encounters:  11/12/22 111 lb 6.4 oz (50.5 kg)  05/01/22 107 lb (48.5 kg)  10/24/21 107 lb 6.4 oz (48.7 kg)    Diabetic Foot Exam - Simple   No data filed    Lab Results  Component Value Date   WBC 4.7 11/06/2022   HGB 13.6 11/06/2022   HCT 40.9 11/06/2022   PLT 231.0 11/06/2022   GLUCOSE 83 11/06/2022   CHOL 205 (H) 11/06/2022   TRIG 59.0 11/06/2022   HDL 74.90 11/06/2022   LDLCALC 118 (H) 11/06/2022   ALT 15 11/06/2022   AST 19 11/06/2022   NA 140 11/06/2022   K 4.5 11/06/2022   CL 104 11/06/2022   CREATININE 0.66 11/06/2022   BUN 13 11/06/2022   CO2 29 11/06/2022   TSH 2.53 11/06/2022   HGBA1C 5.8 11/06/2022    Lab Results  Component Value Date   TSH 2.53 11/06/2022   Lab Results  Component Value Date   WBC 4.7 11/06/2022   HGB 13.6 11/06/2022   HCT 40.9 11/06/2022   MCV 90.9 11/06/2022   PLT 231.0 11/06/2022   Lab Results  Component Value Date   NA 140 11/06/2022   K 4.5 11/06/2022   CO2 29 11/06/2022   GLUCOSE 83 11/06/2022   BUN 13 11/06/2022   CREATININE 0.66 11/06/2022   BILITOT 0.5  11/06/2022   ALKPHOS 65 11/06/2022   AST 19 11/06/2022   ALT 15 11/06/2022   PROT 6.3 11/06/2022   ALBUMIN 4.2 11/06/2022   CALCIUM 9.1 11/06/2022   GFR 92.64 11/06/2022   Lab Results  Component Value Date   CHOL 205 (H) 11/06/2022   Lab Results  Component Value Date   HDL 74.90 11/06/2022   Lab Results  Component Value Date   LDLCALC 118 (H) 11/06/2022   Lab Results  Component Value Date   TRIG 59.0 11/06/2022   Lab Results  Component Value Date   CHOLHDL 3 11/06/2022   Lab Results  Component Value Date   HGBA1C 5.8 11/06/2022       Assessment & Plan:  Anxiety Assessment & Plan: Uses Alprazolam sparingly with adequate results   Hyperlipidemia, unspecified hyperlipidemia type Assessment & Plan: Encourage heart healthy diet such as MIND or DASH diet, increase exercise, avoid trans fats, simple carbohydrates and processed foods, consider a krill or fish or flaxseed oil cap daily.    Orders: -     Lipid panel; Future  Osteoporosis, unspecified osteoporosis type, unspecified pathological fracture presence Assessment & Plan: Encouraged to get adequate exercise, calcium and vitamin d intake. Discussed at length taking Fosamax or Prolia but patient declines for now  Orders: -     VITAMIN D 25 Hydroxy (Vit-D Deficiency, Fractures); Future  Vitamin D deficiency Assessment & Plan: Supplement and monitor was normal but low normal continue 2000 IU daily but twice a week take 4000 IU  Orders: -     VITAMIN D 25 Hydroxy (Vit-D Deficiency, Fractures); Future  Preventative health care Assessment & Plan: Patient encouraged to maintain heart healthy diet, regular exercise, adequate sleep. Consider daily probiotics. Take medications as prescribed. Labs ordered and reviewed. Colonoscopy 2022 repeat in 10 years. Pap 2023. MM April 2023 repeat this year. dexa 2023 repeat in 2 years. Discussed need for ACP documents   Abnormal TSH -     TSH;  Future  Hyperglycemia Assessment & Plan: hgba1c acceptable, minimize simple carbs. Increase exercise as tolerated.   Orders: -     Hemoglobin A1c; Future -     Comprehensive metabolic panel; Future  Eosinophilia, unspecified type Assessment & Plan: Very slight and likely related to allergies will continue to monitor  Orders: -     CBC with Differential/Platelet; Future    Danise Edge, MD

## 2022-11-12 NOTE — Assessment & Plan Note (Addendum)
Patient encouraged to maintain heart healthy diet, regular exercise, adequate sleep. Consider daily probiotics. Take medications as prescribed. Labs ordered and reviewed. Colonoscopy 2022 repeat in 10 years. Pap 2023. MM April 2023 repeat this year. dexa 2023 repeat in 2 years. Discussed need for ACP documents

## 2022-11-13 DIAGNOSIS — R739 Hyperglycemia, unspecified: Secondary | ICD-10-CM | POA: Insufficient documentation

## 2022-11-13 DIAGNOSIS — D721 Eosinophilia, unspecified: Secondary | ICD-10-CM | POA: Insufficient documentation

## 2022-11-13 NOTE — Assessment & Plan Note (Signed)
Very slight and likely related to allergies will continue to monitor

## 2022-11-13 NOTE — Assessment & Plan Note (Signed)
hgba1c acceptable, minimize simple carbs. Increase exercise as tolerated.  

## 2023-01-17 ENCOUNTER — Telehealth (HOSPITAL_BASED_OUTPATIENT_CLINIC_OR_DEPARTMENT_OTHER): Payer: Self-pay

## 2023-01-31 ENCOUNTER — Telehealth: Payer: Self-pay | Admitting: Family Medicine

## 2023-01-31 NOTE — Addendum Note (Signed)
Addended by: Alysia Penna on: 01/31/2023 09:58 AM   Modules accepted: Orders

## 2023-01-31 NOTE — Telephone Encounter (Signed)
Pt said she has been calling the imaging dept periodically for the last 3 weeks and has left multiple messages but no one has returned her call so she has been unable to get scheduled her mammogram. Pt is frustrated because she cannot get through. Advised her I would send a message to the provider to see if they have another way to reach someone in imaging to get this patient a return call. Please advise patient if you are able to assist

## 2023-01-31 NOTE — Telephone Encounter (Signed)
Order placed from last visit for screening mammogram.  Will contact imaging for assistance.

## 2023-02-01 NOTE — Telephone Encounter (Signed)
 Scheduled for 02/18/23

## 2023-02-18 ENCOUNTER — Ambulatory Visit (HOSPITAL_BASED_OUTPATIENT_CLINIC_OR_DEPARTMENT_OTHER)
Admission: RE | Admit: 2023-02-18 | Discharge: 2023-02-18 | Disposition: A | Discharge status: Home / Self Care | Payer: 59 | Source: Ambulatory Visit | Attending: Family Medicine | Admitting: Family Medicine

## 2023-02-18 ENCOUNTER — Encounter (HOSPITAL_BASED_OUTPATIENT_CLINIC_OR_DEPARTMENT_OTHER): Payer: Self-pay

## 2023-02-18 DIAGNOSIS — Z1231 Encounter for screening mammogram for malignant neoplasm of breast: Secondary | ICD-10-CM | POA: Diagnosis not present

## 2023-03-01 DIAGNOSIS — F322 Major depressive disorder, single episode, severe without psychotic features: Secondary | ICD-10-CM | POA: Diagnosis not present

## 2023-03-06 DIAGNOSIS — L570 Actinic keratosis: Secondary | ICD-10-CM | POA: Diagnosis not present

## 2023-03-25 ENCOUNTER — Telehealth: Payer: Self-pay | Admitting: Family Medicine

## 2023-03-25 NOTE — Telephone Encounter (Signed)
Prescription Request  03/25/2023  Is this a "Controlled Substance" medicine? Yes  LOV: 11/12/2022  What is the name of the medication or equipment?   metoprolol succinate (TOPROL-XL) 25 MG 24 hr tablet [[id number]]  DISCONTINUED  ALPRAZolam (XANAX) 0.25 MG tablet [[id number]]  Have you contacted your pharmacy to request a refill? No   Which pharmacy would you like this sent to?  [id number] - , [place] - [address] [place] [address] Phone: [phone] Fax: [phone]    Patient notified that their request is being sent to the clinical staff for review and that they should receive a response within 2 business days.   Please advise at Mobile [phone] (mobile)

## 2023-03-26 ENCOUNTER — Other Ambulatory Visit: Payer: Self-pay | Admitting: Family Medicine

## 2023-03-26 MED ORDER — ALPRAZOLAM 0.25 MG PO TABS: 0.2500 mg | ORAL_TABLET | Freq: Two times a day (BID) | ORAL | 0 refills | Status: DC | PRN

## 2023-04-29 ENCOUNTER — Other Ambulatory Visit (INDEPENDENT_AMBULATORY_CARE_PROVIDER_SITE_OTHER): Payer: PPO

## 2023-04-29 DIAGNOSIS — D721 Eosinophilia, unspecified: Secondary | ICD-10-CM

## 2023-04-29 DIAGNOSIS — R739 Hyperglycemia, unspecified: Secondary | ICD-10-CM

## 2023-04-29 DIAGNOSIS — M810 Age-related osteoporosis without current pathological fracture: Secondary | ICD-10-CM | POA: Diagnosis not present

## 2023-04-29 DIAGNOSIS — R7989 Other specified abnormal findings of blood chemistry: Secondary | ICD-10-CM | POA: Diagnosis not present

## 2023-04-29 DIAGNOSIS — E559 Vitamin D deficiency, unspecified: Secondary | ICD-10-CM | POA: Diagnosis not present

## 2023-04-29 DIAGNOSIS — E785 Hyperlipidemia, unspecified: Secondary | ICD-10-CM

## 2023-04-29 LAB — VITAMIN D 25 HYDROXY (VIT D DEFICIENCY, FRACTURES): VITD: 29.22 ng/mL — ABNORMAL LOW (ref 30.00–100.00)

## 2023-04-29 LAB — COMPREHENSIVE METABOLIC PANEL
ALT: 20 U/L (ref 0–35)
AST: 21 U/L (ref 0–37)
Albumin: 4.4 g/dL (ref 3.5–5.2)
Alkaline Phosphatase: 70 U/L (ref 39–117)
BUN: 10 mg/dL (ref 6–23)
CO2: 27 meq/L (ref 19–32)
Calcium: 9.3 mg/dL (ref 8.4–10.5)
Chloride: 105 meq/L (ref 96–112)
Creatinine, Ser: 0.6 mg/dL (ref 0.40–1.20)
GFR: 94.48 mL/min (ref >60.00)
Glucose, Bld: 82 mg/dL (ref 70–99)
Potassium: 4.5 meq/L (ref 3.5–5.1)
Sodium: 139 meq/L (ref 135–145)
Total Bilirubin: 0.5 mg/dL (ref 0.2–1.2)
Total Protein: 6.7 g/dL (ref 6.0–8.3)

## 2023-04-29 LAB — CBC WITH DIFFERENTIAL/PLATELET
Basophils Absolute: 0.1 10*3/uL (ref 0.0–0.1)
Basophils Relative: 0.9 % (ref 0.0–3.0)
Eosinophils Absolute: 0.3 10*3/uL (ref 0.0–0.7)
Eosinophils Relative: 3.8 % (ref 0.0–5.0)
HCT: 43.4 % (ref 36.0–46.0)
Hemoglobin: 13.9 g/dL (ref 12.0–15.0)
Lymphocytes Relative: 16.8 % (ref 12.0–46.0)
Lymphs Abs: 1.2 10*3/uL (ref 0.7–4.0)
MCHC: 32 g/dL (ref 30.0–36.0)
MCV: 92.3 fL (ref 78.0–100.0)
Monocytes Absolute: 0.5 10*3/uL (ref 0.1–1.0)
Monocytes Relative: 7.7 % (ref 3.0–12.0)
Neutro Abs: 4.9 10*3/uL (ref 1.4–7.7)
Neutrophils Relative %: 70.8 % (ref 43.0–77.0)
Platelets: 317 10*3/uL (ref 150.0–400.0)
RBC: 4.7 Mil/uL (ref 3.87–5.11)
RDW: 13 % (ref 11.5–15.5)
WBC: 6.9 10*3/uL (ref 4.0–10.5)

## 2023-04-29 LAB — LIPID PANEL
Cholesterol: 196 mg/dL (ref 0–200)
HDL: 70.5 mg/dL (ref >39.00)
LDL Cholesterol: 115 mg/dL — ABNORMAL HIGH (ref 0–99)
NonHDL: 125.88
Total CHOL/HDL Ratio: 3
Triglycerides: 56 mg/dL (ref 0.0–149.0)
VLDL: 11.2 mg/dL (ref 0.0–40.0)

## 2023-04-29 LAB — TSH: TSH: 1.91 u[IU]/mL (ref 0.35–5.50)

## 2023-04-29 LAB — HEMOGLOBIN A1C: Hgb A1c MFr Bld: 5.8 % (ref 4.6–6.5)

## 2023-05-06 NOTE — Assessment & Plan Note (Signed)
Supplement and monitor was normal but low normal continue 2000 IU daily but twice a week take 4000 IU

## 2023-05-06 NOTE — Assessment & Plan Note (Signed)
hgba1c acceptable, minimize simple carbs. Increase exercise as tolerated.  

## 2023-05-06 NOTE — Assessment & Plan Note (Signed)
Encouraged to get adequate exercise, calcium and vitamin d intake 

## 2023-05-06 NOTE — Assessment & Plan Note (Signed)
Encourage heart healthy diet such as MIND or DASH diet, increase exercise, avoid trans fats, simple carbohydrates and processed foods, consider a krill or fish or flaxseed oil cap daily.  °

## 2023-05-07 ENCOUNTER — Encounter: Payer: Self-pay | Admitting: Family Medicine

## 2023-05-07 ENCOUNTER — Telehealth: Payer: PPO | Admitting: Family Medicine

## 2023-05-07 ENCOUNTER — Telehealth: Payer: Self-pay | Admitting: *Deleted

## 2023-05-07 DIAGNOSIS — R7989 Other specified abnormal findings of blood chemistry: Secondary | ICD-10-CM

## 2023-05-07 DIAGNOSIS — E785 Hyperlipidemia, unspecified: Secondary | ICD-10-CM

## 2023-05-07 DIAGNOSIS — E559 Vitamin D deficiency, unspecified: Secondary | ICD-10-CM

## 2023-05-07 DIAGNOSIS — M810 Age-related osteoporosis without current pathological fracture: Secondary | ICD-10-CM | POA: Diagnosis not present

## 2023-05-07 DIAGNOSIS — R739 Hyperglycemia, unspecified: Secondary | ICD-10-CM | POA: Diagnosis not present

## 2023-05-07 DIAGNOSIS — F419 Anxiety disorder, unspecified: Secondary | ICD-10-CM | POA: Diagnosis not present

## 2023-05-07 MED ORDER — FLUOXETINE HCL 10 MG PO TABS: 10.0000 mg | ORAL_TABLET | Freq: Every day | ORAL | 3 refills | Status: DC

## 2023-05-07 MED ORDER — ALPRAZOLAM 0.25 MG PO TABS: 0.2500 mg | ORAL_TABLET | Freq: Two times a day (BID) | ORAL | 0 refills | Status: AC | PRN

## 2023-05-07 NOTE — Progress Notes (Signed)
MyChart Video Visit    Virtual Visit via Video Note   This patient is at least at moderate risk for complications without adequate follow up. This format is felt to be most appropriate for this patient at this time. Physical exam was limited by quality of the video and audio technology used for the visit. [name], CMA was able to get the patient set up on a video visit.  Patient location: Home Patient and provider in visit Provider location: Office  I discussed the limitations of evaluation and management by telemedicine and the availability of in person appointments. The patient expressed understanding and agreed to proceed.  Visit Date: 05/07/2023  Today's healthcare provider: Danise Edge, MD     Subjective:    Patient ID: Catherine Haynes, female    DOB: 1955-07-30, 67 y.o.   MRN: [id number]  Chief Complaint  Patient presents with   Follow-up    HPI Discussed the use of AI scribe software for clinical note transcription with the patient, who gave verbal consent to proceed.  History of Present Illness   Patient is a 67 year old female in today for follow up on chronic medical concerns. No recent febrile illness or acute hospitalizations. She is undergoing a great deal in her life presently. She is going through a divorce and is living in her own apartment now. She had been trying to make her marriage work but his drinking just became too much. She is ready to start on some medications and counseling to help manage her anhedonia and anxiety.        Past Medical History:  Diagnosis Date    social 12/29/2014   Abdominal aortic ectasia (HCC) 12/11/2014   Allergy    Anxiety 10/24/2021   Atrial fibrillation (HCC) 12/11/2014   BCC (basal cell carcinoma), face 12/11/2014   Cervical cancer screening 12/05/2015   History of shingles 01/24/2017   Hx: UTI (urinary tract infection) 12/11/2014   Hyperlipidemia    Mitral valve prolapse    Morbid obesity (HCC) 12/11/2014   Osteoporosis  12/11/2014   Paroxysmal atrial fibrillation (HCC) 12/11/2014   Preventative health care 12/05/2015   Vitamin D deficiency 12/29/2014    Past Surgical History:  Procedure Laterality Date   CESAREAN SECTION     TONSILLECTOMY      Family History  Problem Relation Age of Onset   Stroke Mother        TIA   Hypertension Mother    Hyperlipidemia Mother    Diabetes Mother        diet controlled   Osteoporosis Mother    Stroke Father    Hypertension Father    Diabetes Father    Hyperlipidemia Father    Mental illness Sister 30       suicide   Heart disease Maternal Grandmother        chf   Osteoporosis Maternal Grandmother    Alcohol abuse Maternal Grandfather    Cancer Paternal Grandmother    Diabetes Paternal Grandmother    Heart disease Paternal Grandfather     Social History   Socioeconomic History   Marital status: Married    Spouse name: Not on file   Number of children: Not on file   Years of education: Not on file   Highest education level: Not on file  Occupational History   Occupation: clean houses   Tobacco Use   Smoking status: Never   Smokeless tobacco: Never  Vaping Use   Vaping status:  Never Used  Substance and Sexual Activity   Alcohol use: Yes    Alcohol/week: 0.0 standard drinks of alcohol    Comment: social.   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     Cholecalciferol  (VITAMIN D) 50 MCG (2000 UT) tablet Take 2,000 Units by mouth daily.     FEXOFENADINE HCL PO Take by mouth.     magnesium (MAGTAB) 84 MG ( ) TBCR SR tablet Take 84 mg by mouth.     Omega-3 Fatty Acids (FISH OIL) 1000 MG CPDR Take 1 capsule by mouth daily.     ALPRAZolam (XANAX) 0.25 MG tablet Take 1 tablet (0.25 mg total) by mouth 2 (two) times daily as needed for anxiety. 10 tablet 0   No facility-administered medications prior to visit.    No Known Allergies  Review of Systems  Constitutional:  Negative for fever and malaise/fatigue.  HENT:  Negative for congestion.   Eyes:  Negative for blurred vision.  Respiratory:  Negative for shortness of breath.   Cardiovascular:  Negative for chest pain, palpitations and leg swelling.  Gastrointestinal:  Negative for abdominal pain, blood in stool and nausea.  Genitourinary:  Negative for dysuria and frequency.  Musculoskeletal:  Negative for falls.  Skin:  Negative for rash.  Neurological:  Negative for dizziness, loss of consciousness and headaches.  Endo/Heme/Allergies:  Negative for environmental allergies.  Psychiatric/Behavioral:  Positive for depression. The patient is nervous/anxious.        Objective:    Physical Exam Constitutional:      General: She is not in acute distress.    Appearance: Normal appearance. She is not ill-appearing or toxic-appearing.  HENT:     Head: Normocephalic and atraumatic.     Right Ear: External ear normal.     Left Ear: External ear normal.     Nose: Nose normal.  Eyes:     General:        Right eye: No discharge.        Left eye: No discharge.  Pulmonary:     Effort: Pulmonary effort is normal.  Skin:    Findings: No rash.  Neurological:     Mental Status: She is alert and oriented to person, place, and time.  Psychiatric:        Behavior: Behavior normal.     There were no vitals taken for this visit. Wt Readings from Last 3 Encounters:  11/12/22 111 lb 6.4 oz (50.5 kg)   05/01/22 107 lb (48.5 kg)  10/24/21 107 lb 6.4 oz (48.7 kg)       Assessment & Plan:  Hyperglycemia Assessment & Plan: hgba1c acceptable, minimize simple carbs. Increase exercise as tolerated.    Hyperlipidemia, unspecified hyperlipidemia type Assessment & Plan: Encourage heart healthy diet such as MIND or DASH diet, increase exercise, avoid trans fats, simple  carbohydrates and processed foods, consider a krill or fish or flaxseed oil cap daily.     Osteoporosis, unspecified osteoporosis type, unspecified pathological fracture presence Assessment & Plan: Encouraged to get adequate exercise, calcium and vitamin d intake.   Orders: -     DG Bone Density; Future  Vitamin D deficiency Assessment & Plan: Supplement and monitor was normal but low normal continue 2000 IU daily but twice a week take 4000 IU   Anxiety Assessment & Plan: She is presently is undergoing a divorce due to her husband's alcoholism and she is managing fairly well considering. Did not find therapy helpful and she has friends she has been able to lean on. She is going to try and find a good counselor again   Other orders -     ALPRAZolam; Take 1 tablet (0.25 mg total) by mouth 2 (two) times daily as needed for anxiety.  Dispense: 10 tablet; Refill: 0 -     FLUoxetine HCl; Take 1 tablet (10 mg total) by mouth daily.  Dispense: 30 tablet; Refill: 3     Assessment and Plan    Stress and Anxiety Patient is going through a divorce and experiencing significant life changes. She has been using alprazolam sparingly for acute anxiety episodes. Discussed the benefits of consistent therapy and the challenges of finding a suitable therapist. Also discussed the potential benefits of starting an SSRI for more consistent management of anxiety. -Start Fluoxetine 10mg  daily. -Refill alprazolam for acute anxiety episodes. -Encourage patient to seek therapy and offer assistance with referral if needed. -Check in 3 months  to assess response to Fluoxetine.  Vitamin D Deficiency Slight decrease in Vitamin D levels. -Increase Vitamin D supplementation by 1000 IU daily.  Osteoporosis Patient has a history of osteoporosis and is due for a DEXA scan. -Order DEXA scan for late March/April 2025. -Discuss results and potential treatment options at May 2025 visit.  General Health Maintenance -Advise patient to get Prevnar 20 pneumonia vaccine at the pharmacy. -Schedule physical for May 2025. -Remind patient that Tetanus vaccine is due in 2027, but can be taken early if an incident occurs after 2022.         I discussed the assessment and treatment plan with the patient. The patient was provided an opportunity to ask questions and all were answered. The patient agreed with the plan and demonstrated an understanding of the instructions.   The patient was advised to call back or seek an in-person evaluation if the symptoms worsen or if the condition fails to improve as anticipated.  Danise Edge, MD [facility] [phone] (phone) [phone] (fax)  [facility]

## 2023-05-07 NOTE — Telephone Encounter (Signed)
Pt needs f/u virtual 3 months med change and 6 month CPE (after mid May)

## 2023-05-07 NOTE — Patient Instructions (Signed)
Prevnar 20 at pharmacy any time

## 2023-05-07 NOTE — Telephone Encounter (Signed)
No answer and voicemail full.

## 2023-05-07 NOTE — Assessment & Plan Note (Signed)
She is presently is undergoing a divorce due to her husband's alcoholism and she is managing fairly well considering. Did not find therapy helpful and she has friends she has been able to lean on. She is going to try and find a good counselor again

## 2023-05-08 NOTE — Telephone Encounter (Signed)
Called patient this morning to schedule follow up appointments. Patient wasn't at home and said she would call back.

## 2023-05-28 NOTE — Telephone Encounter (Signed)
Pt called back to get the necessary appointments scheduled. Please place lab orders for patient's CPE. Lab appt scheduled week prior to physical.

## 2023-05-28 NOTE — Telephone Encounter (Signed)
Orders placed.

## 2023-05-28 NOTE — Addendum Note (Signed)
Addended by: Thelma Barge D on: 05/28/2023 04:08 PM   Modules accepted: Orders

## 2023-07-24 DIAGNOSIS — D2372 Other benign neoplasm of skin of left lower limb, including hip: Secondary | ICD-10-CM | POA: Diagnosis not present

## 2023-07-24 DIAGNOSIS — L814 Other melanin hyperpigmentation: Secondary | ICD-10-CM | POA: Diagnosis not present

## 2023-07-24 DIAGNOSIS — D485 Neoplasm of uncertain behavior of skin: Secondary | ICD-10-CM | POA: Diagnosis not present

## 2023-07-24 DIAGNOSIS — L570 Actinic keratosis: Secondary | ICD-10-CM | POA: Diagnosis not present

## 2023-07-24 DIAGNOSIS — L821 Other seborrheic keratosis: Secondary | ICD-10-CM | POA: Diagnosis not present

## 2023-07-29 ENCOUNTER — Encounter (HOSPITAL_BASED_OUTPATIENT_CLINIC_OR_DEPARTMENT_OTHER): Payer: Self-pay

## 2023-08-05 NOTE — Assessment & Plan Note (Signed)
 hgba1c acceptable, minimize simple carbs. Increase exercise as tolerated.

## 2023-08-05 NOTE — Assessment & Plan Note (Signed)
 Supplement and monitor

## 2023-08-05 NOTE — Assessment & Plan Note (Signed)
 Encouraged to get adequate exercise, calcium and vitamin d intake

## 2023-08-05 NOTE — Assessment & Plan Note (Signed)
 Encourage heart healthy diet such as MIND or DASH diet, increase exercise, avoid trans fats, simple carbohydrates and processed foods, consider a krill or fish or flaxseed oil cap daily.

## 2023-08-06 ENCOUNTER — Encounter: Payer: Self-pay | Admitting: Family Medicine

## 2023-08-06 ENCOUNTER — Telehealth: Payer: PPO | Admitting: Family Medicine

## 2023-08-06 VITALS — Ht 63.0 in | Wt 107.0 lb

## 2023-08-06 DIAGNOSIS — R739 Hyperglycemia, unspecified: Secondary | ICD-10-CM

## 2023-08-06 DIAGNOSIS — M810 Age-related osteoporosis without current pathological fracture: Secondary | ICD-10-CM

## 2023-08-06 DIAGNOSIS — E785 Hyperlipidemia, unspecified: Secondary | ICD-10-CM

## 2023-08-06 DIAGNOSIS — E559 Vitamin D deficiency, unspecified: Secondary | ICD-10-CM

## 2023-08-06 MED ORDER — FLUOXETINE HCL 20 MG PO TABS: 20.0000 mg | ORAL_TABLET | Freq: Every day | ORAL | 1 refills | Status: DC

## 2023-08-06 NOTE — Progress Notes (Signed)
MyChart Video Visit    Virtual Visit via Video Note   This patient is at least at moderate risk for complications without adequate follow up. This format is felt to be most appropriate for this patient at this time. Physical exam was limited by quality of the video and audio technology used for the visit. [name], CMA was able to get the patient set up on a video visit.  Patient location: Home Patient and provider in visit Provider location: Office  I discussed the limitations of evaluation and management by telemedicine and the availability of in person appointments. The patient expressed understanding and agreed to proceed.  Visit Date: 08/06/2023  Today's healthcare provider: Danise Edge, MD     Subjective:    Patient ID: Catherine Haynes, female    DOB: 1955/06/26, 68 y.o.   MRN: [id number]  Chief Complaint  Patient presents with   Follow-up    HPI Discussed the use of AI scribe software for clinical note transcription with the patient, who gave verbal consent to proceed.  History of Present Illness   The patient, with a history of anxiety, presents with a new onset cough that started the previous night. The cough is described as annoying and has the potential to disrupt sleep, but the patient is not yet sure of its impact as it has only been one night. The patient's anxiety has improved with medication, but she still feels a general sense of unease and is considering increasing her medication dosage. The patient is also about to start therapy, which she hopes will further improve her mental health. The patient also mentions a bone density test that she needs to schedule, indicating a possible concern for osteoporosis or other bone health issues.        Past Medical History:  Diagnosis Date    social 12/29/2014   Abdominal aortic ectasia (HCC) 12/11/2014   Allergy    Anxiety 10/24/2021   Atrial fibrillation (HCC) 12/11/2014   BCC (basal cell carcinoma), face 12/11/2014    Cervical cancer screening 12/05/2015   History of shingles 01/24/2017   Hx: UTI (urinary tract infection) 12/11/2014   Hyperlipidemia    Mitral valve prolapse    Morbid obesity (HCC) 12/11/2014   Osteoporosis 12/11/2014   Paroxysmal atrial fibrillation (HCC) 12/11/2014   Preventative health care 12/05/2015   Vitamin D deficiency 12/29/2014    Past Surgical History:  Procedure Laterality Date   CESAREAN SECTION     TONSILLECTOMY      Family History  Problem Relation Age of Onset   Stroke Mother        TIA   Hypertension Mother    Hyperlipidemia Mother    Diabetes Mother        diet controlled   Osteoporosis Mother    Stroke Father    Hypertension Father    Diabetes Father    Hyperlipidemia Father    Mental illness Sister 30       suicide   Heart disease Maternal Grandmother        chf   Osteoporosis Maternal Grandmother    Alcohol abuse Maternal Grandfather    Cancer Paternal Grandmother    Diabetes Paternal Grandmother    Heart disease Paternal Grandfather     Social History   Socioeconomic History   Marital status: Married    Spouse name: Not on file   Number of children: Not on file   Years of education: Not on file   Highest education level:  Not on file  Occupational History   Occupation: clean houses   Tobacco Use   Smoking status: Never   Smokeless tobacco: Never  Vaping Use   Vaping status: Never Used  Substance and Sexual Activity   Alcohol use: Yes    Alcohol/week: 0.0 standard drinks of alcohol    Comment: social.   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, house cleaner, no dietary restrictions  Other Topics Concern   Not on file  Social History Narrative   Not on file   Social Drivers of Health   Financial Resource Strain: Not on file  Food Insecurity: Not on file  Transportation Needs: Not on file  Physical Activity: Not on file  Stress: Not on file  Social Connections: Not on file  Intimate Partner  Violence: Not on file    Outpatient Medications Prior to Visit  Medication Sig Dispense Refill   ALPRAZolam (XANAX) 0.25 MG tablet Take 1 tablet (0.25 mg total) by mouth 2 (two) times daily as needed for anxiety. 10 tablet 0   Calcium Carbonate (CALCIUM 600 PO) Take 1 tablet by mouth 2 (two) times daily.     Cholecalciferol (VITAMIN D) 50 MCG (2000 UT) tablet Take 2,000 Units by mouth daily.     FEXOFENADINE HCL PO Take by mouth.     magnesium (MAGTAB) 84 MG ( ) TBCR SR tablet Take 84 mg by mouth.     Omega-3 Fatty Acids (FISH OIL) 1000 MG CPDR Take 1 capsule by mouth daily.     FLUoxetine (PROZAC) 10 MG tablet Take 1 tablet (10 mg total) by mouth daily. 30 tablet 3   No facility-administered medications prior to visit.    No Known Allergies  Review of Systems  Constitutional:  Negative for fever and malaise/fatigue.  HENT:  Negative for congestion.   Eyes:  Negative for blurred vision.  Respiratory:  Negative for shortness of breath.   Cardiovascular:  Negative for chest pain, palpitations and leg swelling.  Gastrointestinal:  Negative for abdominal pain, blood in stool and nausea.  Genitourinary:  Negative for dysuria and frequency.  Musculoskeletal:  Negative for falls.  Skin:  Negative for rash.  Neurological:  Negative for dizziness, loss of consciousness and headaches.  Endo/Heme/Allergies:  Negative for environmental allergies.  Psychiatric/Behavioral:  Positive for depression. The patient is nervous/anxious.        Objective:    Physical Exam Constitutional:      General: She is not in acute distress.    Appearance: Normal appearance. She is not ill-appearing or toxic-appearing.  HENT:     Head: Normocephalic and atraumatic.     Right Ear: External ear normal.     Left Ear: External ear normal.     Nose: Nose normal.  Eyes:     General:        Right eye: No discharge.        Left eye: No discharge.  Pulmonary:     Effort: Pulmonary effort is normal.   Skin:    Findings: No rash.  Neurological:     Mental Status: She is alert and oriented to person, place, and time.  Psychiatric:        Behavior: Behavior normal.    Ht 5\' 3"  (1.6 m) Comment: Pt stated  Wt 107 lb (48.5 kg) Comment: Pt stated  BMI 18.95 kg/m  Wt Readings from Last 3 Encounters:  08/06/23 107 lb (48.5 kg)  11/12/22 111 lb 6.4 oz (50.5 kg)  05/01/22 107 lb (48.5 kg)       Assessment & Plan:  Vitamin D deficiency Assessment & Plan: Supplement and monitor    Hyperglycemia Assessment & Plan: [id number] acceptable, minimize simple carbs. Increase exercise as tolerated.    Hyperlipidemia, unspecified hyperlipidemia type Assessment & Plan: Encourage heart healthy diet such as MIND or DASH diet, increase exercise, avoid trans fats, simple carbohydrates and processed foods, consider a krill or fish or flaxseed oil cap daily.     Osteoporosis, unspecified osteoporosis type, unspecified pathological fracture presence Assessment & Plan: Encouraged to get adequate exercise, calcium and vitamin d intake.    Other orders -     FLUoxetine HCl; Take 1 tablet (20 mg total) by mouth daily.  Dispense: 90 tablet; Refill: 1     Assessment and Plan    Upper Respiratory Infection Recent onset of cough, no high-grade fevers or sore throat. Likely viral etiology. -Consider Tessalon Perles or Nyquil if cough disrupts sleep. -Contact office if cough worsens or new symptoms develop.  Anxiety Improvement noted with current medication, but still feeling "blah". Initiated therapy with a behavioral health specialist. -Increase fluoxetine to 20mg  daily. she has Alprazolam but so far has not needed it. -Plan to reassess efficacy and tolerance of increased dose in a few months.  Vitamin D Deficiency Previous blood work showed slightly low Vitamin D levels. -Continue Vitamin D supplementation at 3000 IU daily. -Recheck levels at next visit.  General Health  Maintenance -Complete bone density scan at [facility] location. -Schedule virtual visit in May for follow-up and adjustment of anxiety medication if needed. -Continue therapy with behavioral health specialist.         I discussed the assessment and treatment plan with the patient. The patient was provided an opportunity to ask questions and all were answered. The patient agreed with the plan and demonstrated an understanding of the instructions.   The patient was advised to call back or seek an in-person evaluation if the symptoms worsen or if the condition fails to improve as anticipated.  Danise Edge, MD [facility] at [facility] [phone] (phone) [phone] (fax)  [facility]

## 2023-08-09 ENCOUNTER — Encounter: Payer: Self-pay | Admitting: Family Medicine

## 2023-08-11 ENCOUNTER — Other Ambulatory Visit: Payer: Self-pay | Admitting: Family Medicine

## 2023-08-11 MED ORDER — HYDROCODONE BIT-HOMATROP MBR 5-1.5 MG/5ML PO SOLN: 5.0000 mL | Freq: Four times a day (QID) | ORAL | 0 refills | Status: AC | PRN

## 2023-08-26 ENCOUNTER — Ambulatory Visit (INDEPENDENT_AMBULATORY_CARE_PROVIDER_SITE_OTHER): Payer: PPO | Admitting: Psychology

## 2023-08-26 ENCOUNTER — Encounter: Payer: Self-pay | Admitting: Family Medicine

## 2023-08-26 DIAGNOSIS — F419 Anxiety disorder, unspecified: Secondary | ICD-10-CM

## 2023-08-26 NOTE — Progress Notes (Signed)
 Comprehensive Clinical Assessment (CCA) Note  08/26/2023 Canaan Prue [id number]  Time Spent: 3:00 pm - 3:55 pm  : 55 minutes  Chief Complaint: Separated in October 2024, asked for it in August, finding herself a lone after 30+ years of marriage.   Visit Diagnosis: Anxiety disorder, unspecified type [F41.9]    Guardian/Payee:  Self    Paperwork requested: No   Reason for Visit /Presenting Problem: Feeling nervous and on edge, not being able to stop worrying, trouble relaxing, feeling restless, feeling afraid as if something awful might happen, little interest in doing things, feeling down, trouble with sleep, poor appetite, feeling like she's let her family down, and trouble concentrating.  Mental Status Exam: Appearance:   Well Groomed     Behavior:  Appropriate  Motor:  Normal  Speech/Language:   Normal Rate  Affect:  Appropriate  Mood:  normal  Thought process:  normal  Thought content:    WNL  Sensory/Perceptual disturbances:    WNL  Orientation:  oriented to person, place, and time/date  Attention:  Good  Concentration:  Good and Fair  Memory:  WNL  Fund of knowledge:   Good  Insight:    Good  Judgment:   Good  Impulse Control:  Good   Reported Symptoms:  feeling nervous and on edge, not being able to stop worrying, trouble relaxing, feeling restless, feeling afraid as if something awful might happen, little interest in doing things, feeling down, trouble with sleep, poor appetite, feeling like she's let her family down, and trouble concentrating.  Risk Assessment: Danger to Self:  No Self-injurious Behavior: No Danger to Others: No Duty to Warn:no Physical Aggression / Violence:No  Access to Firearms a concern: No  Gang Involvement:No  Patient / guardian was educated about steps to take if suicide or homicide risk level increases between visits: yes While future psychiatric events cannot be accurately predicted, the patient does not currently require acute  inpatient psychiatric care and does not currently meet [facility] involuntary commitment criteria.  Substance Abuse History: Current substance abuse: No     Caffeine: 2 cups of coffee Tobacco: N/A Alcohol: on ocasision, 1-2 glasses of wine per week. Substance use: N/A  Past Psychiatric History:   Previous psychological history is significant for depression when she was 68 yo Outpatient Providers:N/A History of Psych Hospitalization: No  Psychological Testing:  N/A    Abuse History:  Victim of: No.,  No    Report needed: No. Victim of Neglect:No. Perpetrator of  No   Witness / Exposure to Domestic Violence: No   Protective Services Involvement: No  Witness to [employer] Violence:  No   Family History:  Family History  Problem Relation Age of Onset   Stroke Mother        TIA   Hypertension Mother    Hyperlipidemia Mother    Diabetes Mother        diet controlled   Osteoporosis Mother    Stroke Father    Hypertension Father    Diabetes Father    Hyperlipidemia Father    Mental illness Sister 30       suicide   Heart disease Maternal Grandmother        chf   Osteoporosis Maternal Grandmother    Alcohol abuse Maternal Grandfather    Cancer Paternal Grandmother    Diabetes Paternal Grandmother    Heart disease Paternal Grandfather     Living situation: the patient lives with their family  Sexual Orientation: Straight  Relationship Status: separated Patient is separated from spouse If a parent, number of children / ages:25 and 28   Support Systems: friend, daughter, 72 year old mother  Financial Stress:  Yes   Income/Employment/Disability: Employment, works Education officer, environmental houses.   Military Service: No   Educational History: Education: some college  Religion/Sprituality/World View: No a good place with that, grew up in a [employer], lives in [place].  I do believe.   Any cultural differences that may affect / interfere with treatment:  not  applicable   Recreation/Hobbies: Spend time with daughter, friends, work out when she can, I have a cat, and lots of house plants  Stressors: Financial difficulties  , how move forward with renting an apartment, feeling stuck, not motivated to do much, I get wrapped up in others stress (daughter and son)   Strengths: Patient reported that he major strength is that she left. Patient is also bright, motivated, and wants a better life for her and her adult children.   Barriers:  N/A   Legal History: Pending legal issue / charges: The patient has no significant history of legal issues. History of legal issue / charges:  N/A  Medical History/Surgical History: not reviewed Past Medical History:  Diagnosis Date    social 12/29/2014   Abdominal aortic ectasia (HCC) 12/11/2014   Allergy    Anxiety 10/24/2021   Atrial fibrillation (HCC) 12/11/2014   BCC (basal cell carcinoma), face 12/11/2014   Cervical cancer screening 12/05/2015   History of shingles 01/24/2017   Hx: UTI (urinary tract infection) 12/11/2014   Hyperlipidemia    Mitral valve prolapse    Morbid obesity (HCC) 12/11/2014   Osteoporosis 12/11/2014   Paroxysmal atrial fibrillation (HCC) 12/11/2014   Preventative health care 12/05/2015   Vitamin D deficiency 12/29/2014    Past Surgical History:  Procedure Laterality Date   CESAREAN SECTION     TONSILLECTOMY      Medications: Current Outpatient Medications  Medication Sig Dispense Refill   ALPRAZolam (XANAX) 0.25 MG tablet Take 1 tablet (0.25 mg total) by mouth 2 (two) times daily as needed for anxiety. 10 tablet 0   Calcium Carbonate (CALCIUM 600 PO) Take 1 tablet by mouth 2 (two) times daily.     Cholecalciferol (VITAMIN D) 50 MCG (2000 UT) tablet Take 2,000 Units by mouth daily.     FEXOFENADINE HCL PO Take by mouth.     FLUoxetine (PROZAC) 20 MG tablet Take 1 tablet (20 mg total) by mouth daily. 90 tablet 1   HYDROcodone bit-homatropine (HYDROMET) 5-1.5 MG/5ML syrup Take 5 mLs by  mouth every 6 (six) hours as needed for cough. 120 mL 0   magnesium (MAGTAB) 84 MG ( ) TBCR SR tablet Take 84 mg by mouth.     Omega-3 Fatty Acids (FISH OIL) 1000 MG CPDR Take 1 capsule by mouth daily.     No current facility-administered medications for this visit.    No Known Allergies  Diagnoses:  Anxiety disorder, unspecified type [F41.9]    Psychiatric Treatment: Yes , via PCP  Plan of Care: OPT  Narrative:   [name] participated from home, via video, is aware of tele-sessions limitations, and consented to treatment. Therapist participated from office. We reviewed the limits of confidentiality prior to the start of the evaluation. Bisma Klett expressed understanding and agreement to proceed.   Patient is a 68 year old female who presented for an initial assessment. Patient was referred by her PCP Dr. Rogelia Rohrer. Patient reported the  following symptoms:  feeling nervous and on edge, not being able to stop worrying, trouble relaxing, feeling restless, feeling afraid as if something awful might happen, little interest in doing things, feeling down, trouble with sleep, poor appetite, feeling like she's let her family down, and trouble concentrating. Patient denied current and past suicidal ideation, homicidal ideation, and symptoms of psychosis.  Patient denied current or history of tobacco use or drug use. Patient reported drinking one or two glasses of wine per week. Patient identified current supports as her adult children and her aged 41 mother. Patient identified current stressors as being separated from her husband of 30+ years. Patient reported that her relationship with her husband had been "bad for a long time, we were living like roommates, he had been drinking and abusing Klonapin. He got bad liver results, and he quit drinking, then a few months after he quit drinking, he had a seizure. (Maybe doctor didn't know how much he was drinking). After a few days, he went on anti  seizure medication, saw a neurologist, Tim continued to feel bad. Everything to the extreme, he had another seizure, he couldn't drive." Patient's husband decided to stop taking his anti-seizure medication, without patient's knowledge. Patient could no longer sleep in the same room because she was so anxious about her husband's health. Patient's husband had third seizure and he started drinking again. Patient reported that she reminded her husband of her concern and patient's husband did not seem to take her seriously. Patient then had to handle moving out, and the entire separation process has been extremely difficult for the patient and she blames herself for a number of areas in the marriage, even though she did everything for her kids because she was concerned about her husband being with the kids due to his drinking and drug abuse. Berline Lopes is age 90, and son [name] is age 46. [name] went away to school when he was 17 because he was such a good Consulting civil engineer. Patient's daughter is relieved that patient left her husband, [name]'s father. Patient reported an incident of fa biological family member with a significant mental health challenge. 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 It is recommended that patient participate in individual psychotherapy weekly/biweekly.  A follow-up was scheduled to create a treatment plan and begin treatment. Therapist answered all questions during the evaluation and contact information was provided.    [name]

## 2023-09-04 ENCOUNTER — Ambulatory Visit (INDEPENDENT_AMBULATORY_CARE_PROVIDER_SITE_OTHER): Admitting: Psychology

## 2023-09-04 DIAGNOSIS — F419 Anxiety disorder, unspecified: Secondary | ICD-10-CM

## 2023-09-04 NOTE — Progress Notes (Signed)
 [facility] Counselor/Therapist Progress Note  Patient ID: Catherine Haynes, MRN: [id number]   Date: 09/04/23  Time Spent: 2:00 pm - 2:58 : 58 minutes  Treatment Type: Individual Therapy  Reported Symptoms: feeling nervous and on edge, not being able to stop worrying, trouble relaxing, feeling restless, feeling afraid as if something awful might happen, little interest in doing things, feeling down, trouble with sleep, poor appetite, feeling like she's let her family down, and trouble concentrating.   Mental Status Exam: Appearance:  Well Groomed     Behavior: Appropriate  Motor: Normal  Speech/Language:  Normal Rate  Affect: Appropriate  Mood: normal  Thought process: normal  Thought content:   WNL  Sensory/Perceptual disturbances:   WNL  Orientation: oriented to person, place, and time/date  Attention: Good  Concentration: Good  Memory: WNL  Fund of knowledge:  Good  Insight:   Good  Judgment:  Good  Impulse Control: Good   Risk Assessment: Danger to Self:  No Self-injurious Behavior: No Danger to Others: No Duty to Warn:no Physical Aggression / Violence:No  Access to Firearms a concern: No  Gang Involvement:No   Subjective:   Catherine Haynes participated from home, via video and consented to treatment. Therapist participated from home office. I discussed the limitations of evaluation and management by telemedicine and the availability of in person appointments. The patient expressed understanding and agreed to proceed. Shahana reviewed the events of the past week.   We discussed the disease process of alcoholism at length, because patient blames herself for not being able to make her husband, Jorja Loa, stop drinking.   We reviewed numerous treatment approaches including CBT, BA, Problem Solving, and Solution focused therapy.   Psych-education regarding the Azure's diagnosis of Anxiety disorder, unspecified type [F41.9] was provided during the session.   We  discussed Ashwini Jago goals treatment goals which include:   Have an open conversation with [name] about Tim's drinking. Manage the stress of worrying about daughter (ex. Daughter driving to and from [place]). Patient usually does something physical (go to the gym) to distract her. Patient views the world as not a safe place.   Catherine Haynes provided verbal approval of the treatment plan.   Interventions: Psycho-education & Goal Setting.   Diagnosis:  Anxiety disorder, unspecified type [F41.9]   Psychiatric Treatment: Yes , via PCP  Treatment Plan:  Client Abilities/Strengths Mishael is intelligent, self-aware, and motivated for change.   Support System: Friend, daughter, 81 year old mother.  Client Treatment Preferences OPT  Client Statement of Needs Taleigha would like to increase her communication with her adult children, manage her stress in an effective manner.    Treatment Level Weekly/Biweekly  Symptoms  Anxiety: feeling nervous and on edge, not being able to stop worrying, trouble relaxing, feeling restless, feeling afraid as if something awful might happen (Status maintained) Depression: little interest in doing things, feeling down, trouble with sleep, poor appetite, feeling like she's let her family down, and trouble concentrating. (Status maintained).  Goals:   Yee experiences symptoms of anxiety.  Treatment plan signed and available on s-drive:  No, pending signature.    Target Date: 08/25/24 Frequency: Weekly/Biweekly  Progress: 0 Modality: individual    Therapist will provide referrals for additional resources as appropriate.  Therapist will provide psycho-education regarding Otelia's diagnosis and corresponding treatment approaches and interventions. [name] will support the patient's ability to achieve the goals identified. will employ CBT, BA, Problem-solving, Solution Focused, Mindfulness,  coping skills, & other evidenced-based  practices  will be used to promote progress towards healthy functioning to help manage decrease symptoms associated with their diagnosis.   Reduce overall level, frequency, and intensity of the feelings of depression, anxiety and panic evidenced by decreased overall symptoms from 6 to 7 days/week to 0 to 1 days/week per client report for at least 3 consecutive months. Verbally express understanding of the relationship between feelings of anxiety and its impact on thinking patterns and behaviors. Verbalize an understanding of the role that distorted thinking plays in creating fears, excessive worry, and ruminations.   (Satina participated in the creation of the treatment plan)    [name]

## 2023-09-11 ENCOUNTER — Encounter: Admitting: Psychology

## 2023-09-11 NOTE — Progress Notes (Deleted)
                Catherine Haynes

## 2023-09-11 NOTE — Progress Notes (Signed)
 This encounter was created in error - please disregard.

## 2023-09-12 ENCOUNTER — Encounter (INDEPENDENT_AMBULATORY_CARE_PROVIDER_SITE_OTHER): Admitting: Psychology

## 2023-09-12 ENCOUNTER — Telehealth: Payer: Self-pay

## 2023-09-12 ENCOUNTER — Encounter: Payer: Self-pay | Admitting: Emergency Medicine

## 2023-09-12 DIAGNOSIS — F419 Anxiety disorder, unspecified: Secondary | ICD-10-CM

## 2023-09-12 NOTE — Telephone Encounter (Signed)
 Called pt and left pt a VM advising her that [facility] has been trying to reach her to go ahead and schedule bone density test. Advised pt to give our office a call back if she had any further questions or to go ahead and call imaging back to schedule test.    Copied from CRM [id number]. Topic: General - Other >> Sep 12, 2023 10:53 AM Jon Gills C wrote: Reason for CRM: [facility] called in stating that have tried to reach the patient multiple times to be scheduled for a Bone Density test and she has not answered or return calls.

## 2023-09-12 NOTE — Progress Notes (Signed)
 [facility] Counselor/Therapist Progress Note  Patient ID: Catherine Haynes, MRN: [id number]    Date: 09/11/24  Time Spent: 5:00 pm - 5:55 pm :55  minutes  Treatment Type: Individual Therapy.  Reported Symptoms: feeling nervous and on edge, not being able to stop worrying, trouble relaxing, feeling restless, feeling afraid as if something awful might happen, little interest in doing things, feeling down, trouble with sleep, poor appetite, feeling like she's let her family down, and trouble concentrating.   Mental Status Exam: Appearance:  Well Groomed     Behavior: Appropriate  Motor: Normal  Speech/Language:  Normal Rate  Affect: Appropriate  Mood: normal  Thought process: normal  Thought content:   WNL  Sensory/Perceptual disturbances:   WNL  Orientation: oriented to person, place, and time/date  Attention: Good  Concentration: Good  Memory: WNL  Fund of knowledge:  Good  Insight:   Good  Judgment:  Good  Impulse Control: Good   Risk Assessment: Danger to Self:  No Self-injurious Behavior: No Danger to Others: No Duty to Warn:no Physical Aggression / Violence:No  Access to Firearms a concern: No  Gang Involvement:No   Subjective:   [name] participated from home, via video, and consented to treatment. I discussed the limitations of evaluation and management by telemedicine and the availability of in person appointments. The patient expressed understanding and agreed to proceed.  Therapist participated from home office. Aaleigha reviewed the events of the past week.   Patient wanted to talk with her son, [name], and his relationship with his father, the divorce, and patient had additional questions for her son who lives in [place]. Patient was hesitant about talking to her son, so we discussed at length the pros and cons of having that conversation. Patient made commitment to initiate the conversation with her son.   Interventions: Cognitive Behavioral  Therapy  Diagnosis:  Anxiety disorder, unspecified type [F41.9]   Psychiatric Treatment: Yes , via PCP  Treatment Plan:  Client Abilities/Strengths [name] is intelligent, self-aware, and motivated for change  Support System: Friend, daughter, 73 year old mother  Client Treatment Preferences OPT  Client Statement of Needs Lanaiya would like to increase her communication with her adult children, manage her stress in an effective manner.     Treatment Level Weekly/Biweekly  Symptoms   Anxiety: feeling nervous and on edge, not being able to stop worrying, trouble relaxing, feeling restless, feeling afraid as if something awful might happen (Status maintained) Depression: little interest in doing things, feeling down, trouble with sleep, poor appetite, feeling like she's let her family down, and trouble concentrating. (Status maintained).  Goals:   Beverlyn experiences symptoms of anxiety.   Target Date: 08/25/24 Frequency: Weekly/Biweekly  Progress: 0 Modality: individual    Therapist will provide referrals for additional resources as appropriate.  Therapist will provide psycho-education regarding Lashica's diagnosis and corresponding treatment approaches and interventions. [name] will support the patient's ability to achieve the goals identified. will employ CBT, BA, Problem-solving, Solution Focused, Mindfulness,  coping skills, & other evidenced-based practices will be used to promote progress towards healthy functioning to help manage decrease symptoms associated with their diagnosis.   Reduce overall level, frequency, and intensity of the feelings of depression, anxiety and panic evidenced by decreased overall symptoms from 6 to 7 days/week to 0 to 1 days/week per client report for at least 3 consecutive months. Verbally express understanding of the relationship between feelings of depression and anxiety and their impact on thinking patterns and  behaviors. Verbalize an  understanding of the role that distorted thinking plays in creating fears, excessive worry, and ruminations.  ([name] participated in the creation of the treatment plan)   [name]

## 2023-09-12 NOTE — Progress Notes (Deleted)
 [facility] Counselor/Therapist Progress Note  Patient ID: Catherine Haynes, MRN: [id number]    Date: 09/12/23  Time Spent: 5:00 pm - 5:55 pm :  55 minutes  Treatment Type: Individual Therapy.  Reported Symptoms: feeling nervous and on edge, not being able to stop worrying, trouble relaxing, feeling restless, feeling afraid as if something awful might happen, little interest in doing things, feeling down, trouble with sleep, poor appetite, feeling like she's let her family down, and trouble concentrating.     Mental Status Exam: Appearance:  {PSY:22683}     Behavior: {PSY:21022743}  Motor: {PSY:22302}  Speech/Language:  {PSY:22685}  Affect: {PSY:22687}  Mood: {PSY:31886}  Thought process: {PSY:31888}  Thought content:   {PSY:[phone]}  Sensory/Perceptual disturbances:   {PSY:[phone]}  Orientation: {PSY:30297}  Attention: {PSY:22877}  Concentration: {PSY:901-478-3866}  Memory: {[phone]}  Fund of knowledge:  {PSY:901-478-3866}  Insight:   {PSY:901-478-3866}  Judgment:  {PSY:901-478-3866}  Impulse Control: {PSY:901-478-3866}   Risk Assessment: Danger to Self:  {PSY:22692} Self-injurious Behavior: {PSY:22692} Danger to Others: {PSY:22692} Duty to Warn:{PSY:311194} Physical Aggression / Violence:{PSY:21197} Access to Firearms a concern: {PSY:21197} Gang Involvement:{PSY:21197}  Subjective:   Catherine Haynes participated from {Patient Location:[address]::"home"}, via {ONMVIDEOORPHONE:26699}, and consented to treatment. I discussed the limitations of evaluation and management by telemedicine and the availability of in person appointments. The patient expressed understanding and agreed to proceed.  Therapist participated from {onmtherapistlocation:[address]}.  Catherine Haynes reviewed the events of the past week.   ***   Interventions: {PSY:615-309-7153}  Diagnosis:  No diagnosis found.  Psychiatric Treatment: Yes , via PCP   Treatment Plan:  Client Abilities/Strengths Catherine Haynes  is intelligent, self-aware, and motivated for change.   Support System: Friend, daughter, 51 year old mother  Client Treatment Preferences OPT  Client Statement of Needs Catherine Haynes would like to ***   Treatment Level Weekly/Biweekly  Symptoms  Anxiety: feeling nervous and on edge, not being able to stop worrying, trouble relaxing, feeling restless, feeling afraid as if something awful might happen (Status maintained) Depression: little interest in doing things, feeling down, trouble with sleep, poor appetite, feeling like she's let her family down, and trouble concentrating. (Status maintained).  Goals:   Catherine Haynes experiences symptoms of anxiety.   Target Date: 08/25/24 Frequency: Weekly/Biweekly  Progress: 0 Modality: individual    Therapist will provide referrals for additional resources as appropriate.  Therapist will provide psycho-education regarding Catherine Haynes's diagnosis and corresponding treatment approaches and interventions. Catherine Haynes will support the patient's ability to achieve the goals identified. will employ CBT, BA, Problem-solving, Solution Focused, Mindfulness,  coping skills, & other evidenced-based practices will be used to promote progress towards healthy functioning to help manage decrease symptoms associated with their diagnosis.   Reduce overall level, frequency, and intensity of the feelings of depression, anxiety and panic evidenced by decreased overall symptoms from 6 to 7 days/week to 0 to 1 days/week per client report for at least 3 consecutive months. Verbally express understanding of the relationship between feelings of anxiety and their impact on thinking patterns and behaviors. Verbalize an understanding of the role that distorted thinking plays in creating fears, excessive worry, and ruminations.  (Eilidh participated in the creation of the treatment plan)    Catherine Haynes

## 2023-09-16 ENCOUNTER — Ambulatory Visit (INDEPENDENT_AMBULATORY_CARE_PROVIDER_SITE_OTHER)
Admission: RE | Admit: 2023-09-16 | Discharge: 2023-09-16 | Disposition: A | Discharge status: Home / Self Care | Source: Ambulatory Visit | Attending: Family Medicine | Admitting: Family Medicine

## 2023-09-16 DIAGNOSIS — Z780 Asymptomatic menopausal state: Secondary | ICD-10-CM | POA: Diagnosis not present

## 2023-09-16 DIAGNOSIS — M810 Age-related osteoporosis without current pathological fracture: Secondary | ICD-10-CM | POA: Diagnosis not present

## 2023-09-17 ENCOUNTER — Encounter: Payer: Self-pay | Admitting: Family Medicine

## 2023-09-18 ENCOUNTER — Ambulatory Visit (INDEPENDENT_AMBULATORY_CARE_PROVIDER_SITE_OTHER): Admitting: Psychology

## 2023-09-18 DIAGNOSIS — F419 Anxiety disorder, unspecified: Secondary | ICD-10-CM | POA: Diagnosis not present

## 2023-09-18 NOTE — Progress Notes (Signed)
 [facility] Counselor/Therapist Progress Note  Patient ID: Catherine Haynes, MRN: [id number]    Date: 09/18/23  Time Spent: 2:00 pm  - 2:55 pm  : 55  minutes  Treatment Type: Individual Therapy.  Reported Symptoms:  feeling nervous and on edge, not being able to stop worrying, trouble relaxing, feeling restless, feeling afraid as if something awful might happen, little interest in doing things, feeling down, trouble with sleep, poor appetite, feeling like she's let her family down, and trouble concentrating.   Mental Status Exam: Appearance:  Well Groomed     Behavior: Appropriate  Motor: Normal  Speech/Language:  Normal Rate  Affect: Appropriate  Mood: normal  Thought process: normal  Thought content:   WNL  Sensory/Perceptual disturbances:   WNL  Orientation: oriented to person, place, and time/date  Attention: Good  Concentration: Good  Memory: WNL  Fund of knowledge:  Good  Insight:   Good  Judgment:  Good  Impulse Control: Good   Risk Assessment: Danger to Self:  No Self-injurious Behavior: No Danger to Others: No Duty to Warn:no Physical Aggression / Violence:No  Access to Firearms a concern: No  Gang Involvement:No   Subjective:   [name] participated from home, via video, and consented to treatment. I discussed the limitations of evaluation and management by telemedicine and the availability of in person appointments. The patient expressed understanding and agreed to proceed.  Therapist participated from home office.  Virda reviewed the events of the past week.   Patient followed through with talking with her son, [name], about family dynamics, his relationship with his father, and patient's decision regarding the divorce. Logan told patient that he would like to see her more. Discussion also centered around why patient thinks about certain things, the need for validation, and anger released outward vs inward.   Interventions: Cognitive  Behavioral Therapy  Diagnosis:  Anxiety disorder, unspecified type [F41.9]  Psychiatric Treatment: Yes , via PCP  Treatment Plan:  Client Abilities/Strengths rayvn rickerson is intelligent, self-aware, and motivated for change.  Support System: Friend, daughter, 57 year old mother.  Client Treatment Preferences OPT  Client Statement of Needs Joslyn would like to Ysenia would like to increase her communication with her adult children, manage her stress in an effective manner.    Treatment Level Weekly/Biweekly  Symptoms:  Anxiety: feeling nervous and on edge, not being able to stop worrying, trouble relaxing, feeling restless, feeling afraid as if something awful might happen (Status maintained) Depression: little interest in doing things, feeling down, trouble with sleep, poor appetite, feeling like she's let her family down, and trouble concentrating. (Status maintained).  Goals:   Odella experiences symptoms of anxiety.   Target Date: 08/25/24 Frequency: Weekly/Biweekly  Progress: 0 Modality: individual    Therapist will provide referrals for additional resources as appropriate.  Therapist will provide psycho-education regarding Prerna's diagnosis and corresponding treatment approaches and interventions. [name] will support the patient's ability to achieve the goals identified. will employ CBT, BA, Problem-solving, Solution Focused, Mindfulness,  coping skills, & other evidenced-based practices will be used to promote progress towards healthy functioning to help manage decrease symptoms associated with their diagnosis.   Reduce overall level, frequency, and intensity of the feelings of depression, anxiety and panic evidenced by decreased overall symptoms from 6 to 7 days/week to 0 to 1 days/week per client report for at least 3 consecutive months. Verbally express understanding of the relationship between feelings of anxiety and its impact on thinking patterns and  behaviors. Verbalize an understanding of the role that distorted thinking plays in creating fears, excessive worry, and ruminations.  (Adrieana participated in the creation of the treatment plan)    [name]

## 2023-09-25 ENCOUNTER — Ambulatory Visit: Admitting: Psychology

## 2023-10-02 ENCOUNTER — Ambulatory Visit (INDEPENDENT_AMBULATORY_CARE_PROVIDER_SITE_OTHER): Admitting: Psychology

## 2023-10-02 DIAGNOSIS — F419 Anxiety disorder, unspecified: Secondary | ICD-10-CM | POA: Diagnosis not present

## 2023-10-02 NOTE — Progress Notes (Unsigned)
 [facility] Counselor/Therapist Progress Note  Patient ID: Catherine Haynes, MRN: [id number]    Date: 10/02/23  Time Spent: 2:00 pm - 2:    {onmampm:26702} - *** {onmampm:26702} : *** Minutes  Treatment Type: Individual Therapy.  Reported Symptoms: ***  Mental Status Exam: Appearance:  {PSY:22683}     Behavior: {PSY:21022743}  Motor: {PSY:22302}  Speech/Language:  {PSY:22685}  Affect: {PSY:22687}  Mood: {PSY:31886}  Thought process: {PSY:31888}  Thought content:   {PSY:609-127-2287}  Sensory/Perceptual disturbances:   {[phone]}  Orientation: {PSY:30297}  Attention: {PSY:22877}  Concentration: {[phone] [phone]}  Memory: {PSY:([phone]}  Fund of knowledge:  {PSY:([phone] 281-7877}  Insight:   {PSY:(334) 281-7877}  Judgment:  {PSY:([phone] 281-7877}  Impulse Control: {PSY:([phone] 281-7877}   Risk Assessment: Danger to Self:  {PSY:22692} Self-injurious Behavior: {PSY:22692} Danger to Others: {PSY:22692} Duty to Warn:{PSY:311194} Physical Aggression / Violence:{PSY:21197} Access to Firearms a concern: {PSY:21197} Gang Involvement:{PSY:21197}  Subjective:   Catherine Haynes participated from {Patient Location:[address]::"home"}, via {ONMVIDEOORPHONE:26699}, and consented to treatment. I discussed the limitations of evaluation and management by telemedicine and the availability of in person appointments. The patient expressed understanding and agreed to proceed.  Therapist participated from {onmtherapistlocation:[address]}.  Catherine Haynes reviewed the events of the past week.   Patient's mother is 66 years old, and patient was very worried because she had to be hospitalized for a few days. For the past 20 years patient's mother cleans houses with the patient, 5 days per week, and we are usually done by 1:00 pm. Patient's sister, Catherine Haynes, committed suicide, and that has been difficult on both patient's mother and patient. Catherine Haynes was 66 years old when his mother, patient's sister, committed suicide. Catherine Haynes is  now 66 years old. Patient has Medicaid now but he is not on any disability. Patient is the POA for her nephew, Catherine Haynes, and plans to talk with him about his future plans. Patient will have conversation with her mother and possible Catherine Haynes. "Its been a crummy week".    Interventions: {[phone]}  Diagnosis:  No diagnosis found.  Psychiatric Treatment: {YES/NO:21197}, ***  Treatment Plan:  Client Abilities/Strengths Catherine Haynes ***  Support System: ***  Client Treatment Preferences ***  Client Statement of Needs Catherine Haynes would like to ***   Treatment Level {Frequency of sessions.:26745}  Symptoms  ***   (Status: {Symptom Status:26744}) ***   (Status: {Symptom Status:26744})  Goals:   Catherine Haynes experiences symptoms of ***   Target Date: *** Frequency: {Frequency of sessions.:26745}  Progress: 0 Modality: individual    Therapist will provide referrals for additional resources as appropriate.  Therapist will provide psycho-education regarding Catherine Haynes's diagnosis and corresponding treatment approaches and interventions. Catherine Haynes will support the patient's ability to achieve the goals identified. will employ CBT, BA, Problem-solving, Solution Focused, Mindfulness,  coping skills, & other evidenced-based practices will be used to promote progress towards healthy functioning to help manage decrease symptoms associated with {his/her/their:21314} diagnosis.   Reduce overall level, frequency, and intensity of the feelings of depression, anxiety and panic evidenced by decreased *** from 6 to 7 days/week to 0 to 1 days/week per client report for at least 3 consecutive months. Verbally express understanding of the relationship between feelings of ***depression, ***anxiety and their impact on thinking patterns and behaviors. Verbalize an understanding of the role that distorted thinking plays in creating fears, excessive worry, and ruminations.  (Catherine Haynes participated in the creation of the  treatment plan)   Catherine Haynes

## 2023-10-09 ENCOUNTER — Ambulatory Visit: Admitting: Psychology

## 2023-10-16 ENCOUNTER — Ambulatory Visit: Admitting: Psychology

## 2023-10-17 ENCOUNTER — Telehealth: Payer: Self-pay | Admitting: Family Medicine

## 2023-10-17 ENCOUNTER — Ambulatory Visit (INDEPENDENT_AMBULATORY_CARE_PROVIDER_SITE_OTHER): Admitting: Psychology

## 2023-10-17 DIAGNOSIS — F419 Anxiety disorder, unspecified: Secondary | ICD-10-CM | POA: Diagnosis not present

## 2023-10-17 NOTE — Progress Notes (Signed)
 [facility] Counselor/Therapist Progress Note  Patient ID: Catherine Haynes, MRN: [id number]    Date: 10/17/23  Time Spent: 4:00 pm - 4:56 pm  :56 minutes    Treatment Type: Individual Therapy.  Reported Symptoms: feeling nervous and on edge, not being able to stop worrying, trouble relaxing, feeling restless, feeling afraid as if something awful might happen, little interest in doing things, feeling down, trouble with sleep, poor appetite, feeling like she's let her family down, and trouble concentrating.   Mental Status Exam: Appearance:  Well Groomed     Behavior: Appropriate  Motor: Normal  Speech/Language:  Normal Rate  Affect: Appropriate  Mood: normal  Thought process: normal  Thought content:   WNL  Sensory/Perceptual disturbances:   WNL  Orientation: oriented to person, place, and time/date  Attention: Good  Concentration: Good  Memory: WNL  Fund of knowledge:  Good  Insight:   Good  Judgment:  Good  Impulse Control: Good   Risk Assessment: Danger to Self:  No Self-injurious Behavior: No Danger to Others: No Duty to Warn:no Physical Aggression / Violence:No  Access to Firearms a concern: No  Gang Involvement:No   Subjective:   Catherine Haynes participated from home, via video, and consented to treatment. I discussed the limitations of evaluation and management by telemedicine and the availability of in person appointments. The patient expressed understanding and agreed to proceed.  Therapist participated from home office.  Catherine Haynes reviewed the events of the past week.   "I thought the anxiety thing was all over, and it came back last Wed and Thurs, and then it went away. No rhyme nor reason, to it coming on, nor a rhyme or reason to it going away. I have been going over my mom's every day, but I haven't been spending time with my friends, and I haven't been going to the gym." Patient went with Catherine Haynes to visit her son this past weekend, they took the train,  and they are going again this weekend for Catherine Haynes's birthday. Patient and her mother will go the following weekend to visit patient's mother's sister, which is sad because mother's sister has dementia, Also has a beach three day weekend planned with Catherine Haynes and Catherine Haynes.   Interventions: Cognitive Behavioral Therapy  Diagnosis: Anxiety disorder, unspecified type [F41.9]   Psychiatric Treatment: Yes, via PCP  Treatment Plan:  Client Abilities/Strengths Catherine Haynes is intelligent, self-aware, and motivated for change   Support System: Friends, daughter, 50 year old mother  Client Treatment Preferences OPT  Client Statement of Needs Catherine Haynes would like to increase her communication with her adult children, manage her stress in an effective manner.      Treatment Level Weekly/Biweekly  Symptoms  Anxiety: feeling nervous and on edge, not being able to stop worrying, trouble relaxing, feeling restless, feeling afraid as if something awful might happen (Status maintained) Depression: little interest in doing things, feeling down, trouble with sleep, poor appetite, feeling like she's let her family down, and trouble concentrating. (Status maintained).  Goals:   Catherine Haynes experiences symptoms of anxiety   Target Date: 08/25/24 Frequency: Weekly  Progress: 0 Modality: individual    Therapist will provide referrals for additional resources as appropriate.  Therapist will provide psycho-education regarding Narda's diagnosis and corresponding treatment approaches and interventions. Catherine Haynes will support the patient's ability to achieve the goals identified. will employ CBT, BA, Problem-solving, Solution Focused, Mindfulness,  coping skills, & other evidenced-based practices will be used to promote progress towards healthy functioning to help  manage decrease symptoms associated with their diagnosis.   Reduce overall level, frequency, and intensity of the feelings of depression, anxiety and panic  evidenced by decreased overall symptoms from 6 to 7 days/week to 0 to 1 days/week per client report for at least 3 consecutive months. Verbally express understanding of the relationship between feelings of anxiety and their impact on thinking patterns and behaviors. Verbalize an understanding of the role that distorted thinking plays in creating fears, excessive worry, and ruminations.  (Catherine Haynes participated in the creation of the treatment plan)    Catherine Haynes

## 2023-10-17 NOTE — Telephone Encounter (Unsigned)
 Copied from CRM [id number]. Topic: Medicare AWV >> Oct 17, 2023 11:53 AM Juliana Ocean wrote: Reason for CRM: Called 10/17/2023 to sched AWV - MAILBOX FULL  Rosalee Collins; Care Guide Ambulatory Clinical Support [facility] Direct Dial: [phone]

## 2023-10-23 ENCOUNTER — Ambulatory Visit: Admitting: Psychology

## 2023-10-30 ENCOUNTER — Ambulatory Visit (INDEPENDENT_AMBULATORY_CARE_PROVIDER_SITE_OTHER): Admitting: Psychology

## 2023-10-30 DIAGNOSIS — F419 Anxiety disorder, unspecified: Secondary | ICD-10-CM | POA: Diagnosis not present

## 2023-10-30 NOTE — Progress Notes (Signed)
 [facility] Counselor/Therapist Progress Note  Patient ID: Catherine Haynes, MRN: [id number]    Date: 10/30/23  Time Spent: 4:00 pm - 4:54 pm : 54 minutes  Treatment Type: Individual Therapy.  Reported Symptoms:  feeling nervous and on edge, not being able to stop worrying, trouble relaxing, feeling restless, feeling afraid as if something awful might happen, little interest in doing things, feeling down, trouble with sleep, poor appetite, feeling like she's let her family down, and trouble concentrating.   Mental Status Exam: Appearance:  Well Groomed     Behavior: Appropriate  Motor: Normal  Speech/Language:  Normal Rate  Affect: Appropriate  Mood: normal  Thought process: normal  Thought content:   WNL  Sensory/Perceptual disturbances:   WNL  Orientation: oriented to person, place, and time/date  Attention: Good  Concentration: Good  Memory: WNL  Fund of knowledge:  Good  Insight:   Good  Judgment:  Good  Impulse Control: Good   Risk Assessment: Danger to Self:  No Self-injurious Behavior: No Danger to Others: No Duty to Warn:no Physical Aggression / Violence:No  Access to Firearms a concern: No  Gang Involvement:No   Subjective:   Catherine Haynes participated from , via video, homeand consented to treatment. I discussed the limitations of evaluation and management by telemedicine and the availability of in person appointments. The patient expressed understanding and agreed to proceed.  Therapist participated from home office.  Catherine Haynes reviewed the events of the past week.   Patient had two additional bouts of anxiety, Patient has started going back to the gym, but hadn't reconnected with her friends yet. Planned to do the friend thing this week.   Interventions: Cognitive Behavioral Therapy  Diagnosis:  Anxiety disorder, unspecified type (F41.9)   Psychiatric Treatment: Yes, via PCP  Treatment Level Weekly  Symptoms feeling nervous and on edge, not  being able to stop worrying, trouble relaxing, feeling restless, feeling afraid as if something awful might happen, little interest in doing things, feeling down, trouble with sleep, poor appetite, feeling like she's let her family down, and trouble concentrating.   Goals:   Catherine Haynes experiences symptoms of anxiety   Target Date: 08/25/24 Frequency: Biweekly  Progress: 0 Modality: individual    Therapist will provide referrals for additional resources as appropriate.  Therapist will provide psycho-education regarding Catherine Haynes's diagnosis and corresponding treatment approaches and interventions. Catherine Haynes will support the patient's ability to achieve the goals identified. will employ CBT, BA, Problem-solving, Solution Focused, Mindfulness,  coping skills, & other evidenced-based practices will be used to promote progress towards healthy functioning to help manage decrease symptoms associated with their diagnosis.   Reduce overall level, frequency, and intensity of the feelings of depression, anxiety and panic evidenced by decreased overall symptoms from 6 to 7 days/week to 0 to 1 days/week per client report for at least 3 consecutive months. Verbally express understanding of the relationship between feelings of anxiety and their impact on thinking patterns and behaviors. Verbalize an understanding of the role that distorted thinking plays in creating fears, excessive worry, and ruminations.  (Catherine Haynes participated in the creation of the treatment plan)    Catherine Haynes

## 2023-11-06 ENCOUNTER — Ambulatory Visit: Admitting: Psychology

## 2023-11-10 NOTE — Assessment & Plan Note (Signed)
 Supplement and monitor

## 2023-11-10 NOTE — Assessment & Plan Note (Signed)
 Encouraged to get adequate exercise, calcium and vitamin d intake

## 2023-11-10 NOTE — Assessment & Plan Note (Signed)
 Encourage heart healthy diet such as MIND or DASH diet, increase exercise, avoid trans fats, simple carbohydrates and processed foods, consider a krill or fish or flaxseed oil cap daily.

## 2023-11-10 NOTE — Assessment & Plan Note (Signed)
 hgba1c acceptable, minimize simple carbs. Increase exercise as tolerated.

## 2023-11-12 ENCOUNTER — Telehealth (INDEPENDENT_AMBULATORY_CARE_PROVIDER_SITE_OTHER): Payer: PPO | Admitting: Family Medicine

## 2023-11-12 DIAGNOSIS — M810 Age-related osteoporosis without current pathological fracture: Secondary | ICD-10-CM

## 2023-11-12 DIAGNOSIS — E785 Hyperlipidemia, unspecified: Secondary | ICD-10-CM | POA: Diagnosis not present

## 2023-11-12 DIAGNOSIS — R739 Hyperglycemia, unspecified: Secondary | ICD-10-CM

## 2023-11-12 DIAGNOSIS — E559 Vitamin D deficiency, unspecified: Secondary | ICD-10-CM | POA: Diagnosis not present

## 2023-11-13 ENCOUNTER — Ambulatory Visit (INDEPENDENT_AMBULATORY_CARE_PROVIDER_SITE_OTHER): Admitting: Psychology

## 2023-11-13 ENCOUNTER — Encounter: Payer: Self-pay | Admitting: Family Medicine

## 2023-11-13 DIAGNOSIS — F419 Anxiety disorder, unspecified: Secondary | ICD-10-CM | POA: Diagnosis not present

## 2023-11-13 NOTE — Progress Notes (Signed)
 MyChart Video Visit    Virtual Visit via Video Note   This patient is at least at moderate risk for complications without adequate follow up. This format is felt to be most appropriate for this patient at this time. Physical exam was limited by quality of the video and audio technology used for the visit. [name], CMA was able to get the patient set up on a video visit.  Patient location: Home Patient and provider in visit Provider location: Office  I discussed the limitations of evaluation and management by telemedicine and the availability of in person appointments. The patient expressed understanding and agreed to proceed.  Visit Date: 11/12/2023  Today's healthcare provider: Randie Bustle, MD     Subjective:    Patient ID: Catherine Haynes, female    DOB: 06-02-1956, 68 y.o.   MRN: [id number]  Chief Complaint  Patient presents with   Medical Management of Chronic Issues    Patient presents today for a 3 month follow-up   Quality Metric Gaps    AWV, pneumonia    HPI Discussed the use of AI scribe software for clinical note transcription with the patient, who gave verbal consent to proceed.  History of Present Illness Catherine Haynes is a 68 year old female with osteoporosis who presents for follow-up on bone health and medication management.  She has a history of osteoporosis and is currently managing her condition with lifestyle modifications. She is hesitant to start medication due to concerns about potential side effects, particularly the risk of jaw osteonecrosis. A recent bone density test showed improvement in her spine from a T-score of -3.2 to -2.5, but a slight decrease in her femur from -3.2 to -3.3.  She is taking vitamin D , having increased her dose from 2000 IU to 3000 IU after a mild deficiency was noted in her blood work last November. She is aware of the importance of calcium intake and weight-bearing exercises, although she dislikes lower body  exercises.  She experiences anxiety, which she describes as 'more annoying than anything else.' She has had some palpitations when missing doses of her medication but otherwise no major symptoms such as chest pain or recent illnesses. She is currently on fluoxetine  and has a 90-day supply remaining.  No upset stomach, disrupted sleep, or recent major illnesses. She reports occasional waking up with a start but no significant sleep disturbances.    Past Medical History:  Diagnosis Date    social 12/29/2014   Abdominal aortic ectasia (HCC) 12/11/2014   Allergy    Anxiety 10/24/2021   Atrial fibrillation (HCC) 12/11/2014   BCC (basal cell carcinoma), face 12/11/2014   Cervical cancer screening 12/05/2015   History of shingles 01/24/2017   Hx: UTI (urinary tract infection) 12/11/2014   Hyperlipidemia    Mitral valve prolapse    Morbid obesity (HCC) 12/11/2014   Osteoporosis 12/11/2014   Paroxysmal atrial fibrillation (HCC) 12/11/2014   Preventative health care 12/05/2015   Vitamin D  deficiency 12/29/2014    Past Surgical History:  Procedure Laterality Date   CESAREAN SECTION     TONSILLECTOMY      Family History  Problem Relation Age of Onset   Stroke Mother        TIA   Hypertension Mother    Hyperlipidemia Mother    Diabetes Mother        diet controlled   Osteoporosis Mother    Stroke Father    Hypertension Father    Diabetes Father  Hyperlipidemia Father    Mental illness Sister 6       suicide   Heart disease Maternal Grandmother        chf   Osteoporosis Maternal Grandmother    Alcohol abuse Maternal Grandfather    Cancer Paternal Grandmother    Diabetes Paternal Grandmother    Heart disease Paternal Grandfather     Social History   Socioeconomic History   Marital status: Married    Spouse name: Not on file   Number of children: Not on file   Years of education: Not on file   Highest education level: Not on file  Occupational History   Occupation: clean houses    Tobacco Use   Smoking status: Never   Smokeless tobacco: Never  Vaping Use   Vaping status: Never Used  Substance and Sexual Activity   Alcohol use: Yes    Alcohol/week: 0.0 standard drinks of alcohol    Comment: social.   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, house cleaner, no dietary restrictions  Other Topics Concern   Not on file  Social History Narrative   Not on file   Social Drivers of Health   Financial Resource Strain: Not on file  Food Insecurity: Not on file  Transportation Needs: Not on file  Physical Activity: Not on file  Stress: Not on file  Social Connections: Not on file  Intimate Partner Violence: Not on file    Outpatient Medications Prior to Visit  Medication Sig Dispense Refill   ALPRAZolam  (XANAX ) 0.25 MG tablet Take 1 tablet (0.25 mg total) by mouth 2 (two) times daily as needed for anxiety. 10 tablet 0   Calcium Carbonate (CALCIUM 600 PO) Take 1 tablet by mouth 2 (two) times daily.     Cholecalciferol (VITAMIN D ) 50 MCG (2000 UT) tablet Take 2,000 Units by mouth daily.     FEXOFENADINE HCL PO Take by mouth.     FLUoxetine  (PROZAC ) 20 MG tablet Take 1 tablet (20 mg total) by mouth daily. 90 tablet 1   HYDROcodone  bit-homatropine (HYDROMET) 5-1.5 MG/5ML syrup Take 5 mLs by mouth every 6 (six) hours as needed for cough. 120 mL 0   magnesium (MAGTAB) 84 MG ( ) TBCR SR tablet Take 84 mg by mouth.     Omega-3 Fatty Acids (FISH OIL) 1000 MG CPDR Take 1 capsule by mouth daily.     No facility-administered medications prior to visit.    No Known Allergies  Review of Systems  Constitutional:  Negative for fever and malaise/fatigue.  HENT:  Negative for congestion.   Eyes:  Negative for blurred vision.  Respiratory:  Negative for shortness of breath.   Cardiovascular:  Negative for chest pain, palpitations and leg swelling.  Gastrointestinal:  Negative for abdominal pain, blood in stool and nausea.   Genitourinary:  Negative for dysuria and frequency.  Musculoskeletal:  Negative for falls.  Skin:  Negative for rash.  Neurological:  Negative for dizziness, loss of consciousness and headaches.  Endo/Heme/Allergies:  Negative for environmental allergies.  Psychiatric/Behavioral:  Negative for depression. The patient is nervous/anxious.        Objective:     Physical Exam Constitutional:      General: She is not in acute distress.    Appearance: Normal appearance. She is not ill-appearing or toxic-appearing.  HENT:     Head: Normocephalic and atraumatic.     Right Ear: External ear normal.  Left Ear: External ear normal.     Nose: Nose normal.  Eyes:     General:        Right eye: No discharge.        Left eye: No discharge.  Pulmonary:     Effort: Pulmonary effort is normal.  Skin:    Findings: No rash.  Neurological:     Mental Status: She is alert and oriented to person, place, and time.  Psychiatric:        Behavior: Behavior normal.     There were no vitals taken for this visit. Wt Readings from Last 3 Encounters:  08/06/23 107 lb (48.5 kg)  11/12/22 111 lb 6.4 oz (50.5 kg)  05/01/22 107 lb (48.5 kg)       Assessment & Plan:  Hyperglycemia Assessment & Plan: hgba1c acceptable, minimize simple carbs. Increase exercise as tolerated.    Hyperlipidemia, unspecified hyperlipidemia type Assessment & Plan: Encourage heart healthy diet such as MIND or DASH diet, increase exercise, avoid trans fats, simple carbohydrates and processed foods, consider a krill or fish or flaxseed oil cap daily.     Osteoporosis, unspecified osteoporosis type, unspecified pathological fracture presence Assessment & Plan: Encouraged to get adequate exercise, calcium and vitamin d  intake.    Vitamin D  deficiency Assessment & Plan: Supplement and monitor       Assessment and Plan Assessment & Plan Osteoporosis Improved spine bone density, slight decrease in femur  density. Discussed fracture risks versus rare side effects of treatment. Emphasized exercise and nutrition for bone health. - Continue calcium and vitamin D  supplementation. - Encourage regular weight-bearing exercises. - Discuss management and treatment options in July.  Vitamin D  deficiency Mild deficiency with current supplementation increased. - Recheck vitamin D  levels in July.  Anxiety Improved symptoms with current medication. Occasional palpitations when doses missed. Therapy sessions beneficial. - Continue Alprazolam  as needed. - Follow-up in July to reassess management.     I discussed the assessment and treatment plan with the patient. The patient was provided an opportunity to ask questions and all were answered. The patient agreed with the plan and demonstrated an understanding of the instructions.   The patient was advised to call back or seek an in-person evaluation if the symptoms worsen or if the condition fails to improve as anticipated.  Randie Bustle, MD [facility] at [facility] [phone] (phone) [phone] (fax)  [facility]

## 2023-11-13 NOTE — Progress Notes (Signed)
 [facility] Counselor/Therapist Progress Note  Patient ID: Catherine Haynes, MRN: [id number]    Date: 11/13/23  Time Spent: 4:00 pm - 4:54 : 54 minutes  Treatment Type: Individual Therapy.  Reported Symptoms:  feeling nervous and on edge, not being able to stop worrying, trouble relaxing, feeling restless, feeling afraid as if something awful might happen, little interest in doing things, feeling down, trouble with sleep, poor appetite, feeling like she's let her family down, and trouble concentrating.   Mental Status Exam: Appearance:  Well Groomed     Behavior: Appropriate  Motor: Normal  Speech/Language:  Normal Rate  Affect: Appropriate  Mood: normal  Thought process: normal  Thought content:   WNL  Sensory/Perceptual disturbances:   WNL  Orientation: oriented to person, place, and time/date  Attention: Good  Concentration: Good  Memory: WNL  Fund of knowledge:  Good  Insight:   Good  Judgment:  Good  Impulse Control: Good   Risk Assessment: Danger to Self:  No Self-injurious Behavior: No Danger to Others: No Duty to Warn:no Physical Aggression / Violence:No  Access to Firearms a concern: No  Gang Involvement:No   Subjective:   Catherine Haynes participated from home, via video, and consented to treatment. I discussed the limitations of evaluation and management by telemedicine and the availability of in person appointments. The patient expressed understanding and agreed to proceed.  Therapist participated from home office.  Catherine Haynes reviewed the events of the past week.   Patient's wedding anniversary is coming up and we discussed what that means today for her. Patient shared that she had asked for a divorce earlier in the marriage. Jenna (patient's sister) had mental illness, and attempted suicide many times. Patient summarized that she would have done anything to help Jenna, but didn't know what to do. The kids were young the first time patient considered  getting a divorce, and mentioned it to her husband  Interventions: Cognitive Behavioral Therapy  Diagnosis:   Anxiety disorder, unspecified type [F41.9]  Psychiatric Treatment: Yes , via PCP  Treatment Plan:  Client Abilities/Strengths Catherine Haynes is intelligent, self-aware, and motivated for change   Support System: Friends, daughter, 59 year old mother  Client Treatment Preferences OPT  Client Statement of Needs Catherine Haynes would like to increase her communication with her adult children, manage her stress in an effective manner.    Treatment Level Weekly/Biweekly  Symptoms Anxiety:feeling nervous and on edge, not being able to stop worrying, trouble relaxing, feeling restless, feeling afraid as if something awful might happen (Status maintained) Depression: little interest in doing things, feeling down, trouble with sleep, poor appetite, feeling like she's let her family down, and trouble concentrating. (Status maintained).  Goals:   Catherine Haynes experiences symptoms of Anxiety:    Target Date: 08/25/24 Frequency: Biweekly  Progress: 0 Modality: individual    Therapist will provide referrals for additional resources as appropriate.  Therapist will provide psycho-education regarding Catherine Haynes's diagnosis and corresponding treatment approaches and interventions. Catherine Haynes will support the patient's ability to achieve the goals identified. will employ CBT, BA, Problem-solving, Solution Focused, Mindfulness,  coping skills, & other evidenced-based practices will be used to promote progress towards healthy functioning to help manage decrease symptoms associated with their diagnosis.   Reduce overall level, frequency, and intensity of the feelings of depression, anxiety and panic evidenced by decreased overall symptoms from 6 to 7 days/week to 0 to 1 days/week per client report for at least 3 consecutive months. Verbally express understanding of the relationship  between feelings of anxiety and  it's  impact on thinking patterns and behaviors. Verbalize an understanding of the role that distorted thinking plays in creating fears, excessive worry, and ruminations.  (Karalina participated in the creation of the treatment plan)    Catherine Haynes

## 2023-11-20 ENCOUNTER — Ambulatory Visit: Admitting: Psychology

## 2023-11-27 ENCOUNTER — Ambulatory Visit (INDEPENDENT_AMBULATORY_CARE_PROVIDER_SITE_OTHER): Admitting: Psychology

## 2023-11-27 DIAGNOSIS — F419 Anxiety disorder, unspecified: Secondary | ICD-10-CM

## 2023-11-27 NOTE — Progress Notes (Signed)
 [facility] Counselor/Therapist Progress Note  Patient ID: Catherine Haynes, MRN: [id number]    Date: 11/27/23  Time Spent: 4:00 pm - 4:55 pm  :55  minutes  Treatment Type: Individual Therapy.  Reported Symptoms:  feeling nervous and on edge, not being able to stop worrying, trouble relaxing, feeling restless, feeling afraid as if something awful might happen, little interest in doing things, feeling down, trouble with sleep, poor appetite, feeling like she's let her family down, and trouble concentrating.   Mental Status Exam: Appearance:  Well Groomed     Behavior: Appropriate  Motor: Normal  Speech/Language:  Normal Rate  Affect: Appropriate  Mood: normal  Thought process: normal  Thought content:   WNL  Sensory/Perceptual disturbances:   WNL  Orientation: oriented to person, place, and time/date  Attention: Good  Concentration: Good  Memory: WNL  Fund of knowledge:  Good  Insight:   Good  Judgment:  Good  Impulse Control: Good   Risk Assessment: Danger to Self:  No Self-injurious Behavior: No Danger to Others: No Duty to Warn:no Physical Aggression / Violence:No  Access to Firearms a concern: No  Gang Involvement:No   Subjective:   Catherine Haynes participated from home, via video, and consented to treatment. I discussed the limitations of evaluation and management by telemedicine and the availability of in person appointments. The patient expressed understanding and agreed to proceed. Therapist participated from home office.  Juel reviewed the events of the past week. Patient read the [employer]on material and the following topics resonated with the patient, regarding during their marriage,  Isolate yourself,  Create a life for yourself and your kids It's not that bad Patient planned to read more about [employer]on because she found it helpful.    Interventions: Cognitive Behavioral Therapy  Diagnosis: Anxiety disorder, unspecified type [F41.9]   Psychiatric  Treatment: Yes , via PCP  Treatment Plan:  Client Abilities/Strengths Catherine Haynes is intelligent, self-aware, and motivated for change   Support System: Friends, daughter, 50 year old mother  Client Treatment Preferences OPT  Client Statement of Needs Catherine Haynes would like to increase her communication with her adult children, manage her stress in an effective manner.     Treatment Level Biweekly  Symptoms Anxiety:feeling nervous and on edge, not being able to stop worrying, trouble relaxing, feeling restless, feeling afraid as if something awful might happen (Status maintained) Depression: little interest in doing things, feeling down, trouble with sleep, poor appetite, feeling like she's let her family down, and trouble concentrating. (Status maintained).  Goals:   Catherine Haynes experiences symptoms of Anxiety  Target Date: 08/25/24 Frequency: Biweekly  Progress: 0 Modality: individual    Therapist will provide referrals for additional resources as appropriate.  Therapist will provide psycho-education regarding Catherine Haynes's diagnosis and corresponding treatment approaches and interventions. Catherine Haynes will support the patient's ability to achieve the goals identified. will employ CBT, BA, Problem-solving, Solution Focused, Mindfulness,  coping skills, & other evidenced-based practices will be used to promote progress towards healthy functioning to help manage decrease symptoms associated with their diagnosis.   Reduce overall level, frequency, and intensity of the feelings of depression, anxiety and panic evidenced by decreased overall symptoms from 6 to 7 days/week to 0 to 1 days/week per client report for at least 3 consecutive months. Verbally express understanding of the relationship between feelings of anxiety and their impact on thinking patterns and behaviors. Verbalize an understanding of the role that distorted thinking plays in creating fears, excessive worry, and ruminations.  (  Catherine Haynes  participated in the creation of the treatment plan)    Catherine Haynes

## 2023-12-11 ENCOUNTER — Ambulatory Visit: Admitting: Psychology

## 2023-12-11 DIAGNOSIS — F419 Anxiety disorder, unspecified: Secondary | ICD-10-CM

## 2023-12-11 NOTE — Progress Notes (Unsigned)
 [facility] Counselor/Therapist Progress Note  Patient ID: Catherine Haynes, MRN: [id number]    Date: 12/11/23  Time Spent: 4:00 pm - 4:53  : 53  minutes  Treatment Type: Individual Therapy.  Reported Symptoms:  feeling nervous and on edge, not being able to stop worrying, trouble relaxing, feeling restless, feeling afraid as if something awful might happen, little interest in doing things, feeling down, trouble with sleep, poor appetite, feeling like she's let her family down, and trouble concentrating.   Mental Status Exam: Appearance:  Well Groomed     Behavior: Appropriate  Motor: Normal  Speech/Language:  Normal Rate  Affect: Appropriate  Mood: normal  Thought process: normal  Thought content:   WNL  Sensory/Perceptual disturbances:   WNL  Orientation: oriented to person, place, and time/date  Attention: Good  Concentration: Good  Memory: WNL  Fund of knowledge:  Good  Insight:   Good  Judgment:  Good  Impulse Control: Good   Risk Assessment: Danger to Self:  No Self-injurious Behavior: No Danger to Others: No Duty to Warn:no Physical Aggression / Violence:No  Access to Firearms a concern: No  Gang Involvement:No   Subjective:   Catherine Haynes participated from home, via video, and consented to treatment. I discussed the limitations of evaluation and management by telemedicine and the availability of in person appointments. The patient expressed understanding and agreed to proceed. Therapist participated from home office.  Catherine Haynes reviewed the events of the past week.   Patient did follow through with getting in contact with her friend whom she had not reached out to but felt obligated to do so. Patient was very judgmental about herself regarding that friend.   Patient's mother is doing better physically and mentally, and patient isn't as concerned as she had been. Continued to work on self-care techniques.   Interventions: Cognitive Behavioral  Therapy  Diagnosis:  Anxiety disorder, unspecified type [F41.9]   Psychiatric Treatment: Yes , via PCP  Treatment Plan:  Client Abilities/Strengths  Catherine Haynes is intelligent, self-aware, and motivated for change   Support System: Friends, daughter, and 36 tear old mother  Client Treatment Preferences OPT  Client Statement of Needs Catherine Haynes would like to increase her communication with her adult children, manage her stress in an effective manner.       Treatment Level Biweekly  Symptoms Anxiety:feeling nervous and on edge, not being able to stop worrying, trouble relaxing, feeling restless, feeling afraid as if something awful might happen (Status maintained) Depression: little interest in doing things, feeling down, trouble with sleep, poor appetite, feeling like she's let her family down, and trouble concentrating. (Status maintained).  Goals:   Catherine Haynes experiences symptoms of anxiety.   Target Date: 08/25/24 Frequency: Biweekly  Progress: 0 Modality: individual    Therapist will provide referrals for additional resources as appropriate.  Therapist will provide psycho-education regarding Catherine Haynes's diagnosis and corresponding treatment approaches and interventions. Catherine Haynes will support the patient's ability to achieve the goals identified. will employ CBT, BA, Problem-solving, Solution Focused, Mindfulness,  coping skills, & other evidenced-based practices will be used to promote progress towards healthy functioning to help manage decrease symptoms associated with their diagnosis.   Reduce overall level, frequency, and intensity of the feelings of depression, anxiety and panic evidenced by decreased overall symptoms from 6 to 7 days/week to 0 to 1 days/week per client report for at least 3 consecutive months. Verbally express understanding of the relationship between feelings of anxiety and its impact on thinking patterns  and behaviors. Verbalize an understanding of the role that  distorted thinking plays in creating fears, excessive worry, and ruminations.  (Catherine Haynes participated in the creation of the treatment plan)    Catherine Haynes

## 2023-12-23 ENCOUNTER — Other Ambulatory Visit (INDEPENDENT_AMBULATORY_CARE_PROVIDER_SITE_OTHER): Payer: PPO

## 2023-12-23 ENCOUNTER — Ambulatory Visit: Payer: Self-pay | Admitting: Family Medicine

## 2023-12-23 DIAGNOSIS — R7989 Other specified abnormal findings of blood chemistry: Secondary | ICD-10-CM | POA: Diagnosis not present

## 2023-12-23 DIAGNOSIS — E559 Vitamin D deficiency, unspecified: Secondary | ICD-10-CM | POA: Diagnosis not present

## 2023-12-23 DIAGNOSIS — E785 Hyperlipidemia, unspecified: Secondary | ICD-10-CM

## 2023-12-23 DIAGNOSIS — R739 Hyperglycemia, unspecified: Secondary | ICD-10-CM | POA: Diagnosis not present

## 2023-12-23 LAB — TSH: TSH: 2.03 u[IU]/mL (ref 0.35–5.50)

## 2023-12-23 LAB — LIPID PANEL
Cholesterol: 231 mg/dL — ABNORMAL HIGH (ref 0–200)
HDL: 76 mg/dL (ref >39.00)
LDL Cholesterol: 142 mg/dL — ABNORMAL HIGH (ref 0–99)
NonHDL: 154.92
Total CHOL/HDL Ratio: 3
Triglycerides: 64 mg/dL (ref 0.0–149.0)
VLDL: 12.8 mg/dL (ref 0.0–40.0)

## 2023-12-23 LAB — CBC WITH DIFFERENTIAL/PLATELET
Basophils Absolute: 0 10*3/uL (ref 0.0–0.1)
Basophils Relative: 1.2 % (ref 0.0–3.0)
Eosinophils Absolute: 0.3 10*3/uL (ref 0.0–0.7)
Eosinophils Relative: 7 % — ABNORMAL HIGH (ref 0.0–5.0)
HCT: 42 % (ref 36.0–46.0)
Hemoglobin: 13.9 g/dL (ref 12.0–15.0)
Lymphocytes Relative: 23.7 % (ref 12.0–46.0)
Lymphs Abs: 1 10*3/uL (ref 0.7–4.0)
MCHC: 33.1 g/dL (ref 30.0–36.0)
MCV: 90.5 fl (ref 78.0–100.0)
Monocytes Absolute: 0.4 10*3/uL (ref 0.1–1.0)
Monocytes Relative: 10.3 % (ref 3.0–12.0)
Neutro Abs: 2.4 10*3/uL (ref 1.4–7.7)
Neutrophils Relative %: 57.8 % (ref 43.0–77.0)
Platelets: 239 10*3/uL (ref 150.0–400.0)
RBC: 4.64 Mil/uL (ref 3.87–5.11)
RDW: 13.2 % (ref 11.5–15.5)
WBC: 4.1 10*3/uL (ref 4.0–10.5)

## 2023-12-23 LAB — COMPREHENSIVE METABOLIC PANEL WITH GFR
ALT: 18 U/L (ref 0–35)
AST: 18 U/L (ref 0–37)
Albumin: 4.6 g/dL (ref 3.5–5.2)
Alkaline Phosphatase: 52 U/L (ref 39–117)
BUN: 13 mg/dL (ref 6–23)
CO2: 28 meq/L (ref 19–32)
Calcium: 8.8 mg/dL (ref 8.4–10.5)
Chloride: 103 meq/L (ref 96–112)
Creatinine, Ser: 0.52 mg/dL (ref 0.40–1.20)
GFR: 97.35 mL/min (ref >60.00)
Glucose, Bld: 82 mg/dL (ref 70–99)
Potassium: 4 meq/L (ref 3.5–5.1)
Sodium: 138 meq/L (ref 135–145)
Total Bilirubin: 0.5 mg/dL (ref 0.2–1.2)
Total Protein: 6.5 g/dL (ref 6.0–8.3)

## 2023-12-23 LAB — HEMOGLOBIN A1C: Hgb A1c MFr Bld: 5.8 % (ref 4.6–6.5)

## 2023-12-23 LAB — VITAMIN D 25 HYDROXY (VIT D DEFICIENCY, FRACTURES): VITD: 34.64 ng/mL (ref 30.00–100.00)

## 2023-12-23 NOTE — Progress Notes (Signed)
 Patient reviewed via Mychart

## 2023-12-25 ENCOUNTER — Ambulatory Visit: Admitting: Psychology

## 2023-12-29 NOTE — Assessment & Plan Note (Signed)
 Patient encouraged to maintain heart healthy diet, regular exercise, adequate sleep. Consider daily probiotics. Take medications as prescribed. Labs ordered and reviewed. Colonoscopy 2022 repeat in 10 years. Pap 2023. MM 01/2023 repeat this year. dexa 2025 repeat in 2 to 3 years. Discussed need for ACP documements

## 2023-12-29 NOTE — Assessment & Plan Note (Signed)
 Encourage heart healthy diet such as MIND or DASH diet, increase exercise, avoid trans fats, simple carbohydrates and processed foods, consider a krill or fish or flaxseed oil cap daily.

## 2023-12-29 NOTE — Assessment & Plan Note (Signed)
 Supplement and monitor

## 2023-12-29 NOTE — Assessment & Plan Note (Signed)
 Encouraged to get adequate exercise, calcium and vitamin d intake

## 2023-12-29 NOTE — Assessment & Plan Note (Signed)
 hgba1c acceptable, minimize simple carbs. Increase exercise as tolerated.

## 2023-12-30 ENCOUNTER — Encounter: Payer: Self-pay | Admitting: Family Medicine

## 2023-12-30 ENCOUNTER — Ambulatory Visit (INDEPENDENT_AMBULATORY_CARE_PROVIDER_SITE_OTHER): Payer: PPO | Admitting: Family Medicine

## 2023-12-30 VITALS — BP 118/72 | HR 76 | Resp 16 | Ht 63.0 in | Wt 112.6 lb

## 2023-12-30 DIAGNOSIS — E559 Vitamin D deficiency, unspecified: Secondary | ICD-10-CM | POA: Diagnosis not present

## 2023-12-30 DIAGNOSIS — M810 Age-related osteoporosis without current pathological fracture: Secondary | ICD-10-CM

## 2023-12-30 DIAGNOSIS — E785 Hyperlipidemia, unspecified: Secondary | ICD-10-CM | POA: Diagnosis not present

## 2023-12-30 DIAGNOSIS — R739 Hyperglycemia, unspecified: Secondary | ICD-10-CM

## 2023-12-30 DIAGNOSIS — Z0000 Encounter for general adult medical examination without abnormal findings: Secondary | ICD-10-CM | POA: Diagnosis not present

## 2023-12-30 NOTE — Progress Notes (Signed)
 Subjective:    Patient ID: Catherine Haynes, female    DOB: 05-Jan-1956, 68 y.o.   MRN: [id number]  Chief Complaint  Patient presents with   Annual Exam    Patient presents today for a physical exam.   Quality Metric Gaps    AWV, pneumococcal    HPI Discussed the use of AI scribe software for clinical note transcription with the patient, who gave verbal consent to proceed.  History of Present Illness Catherine Haynes is a 68 year old female who presents for follow-up on her cholesterol levels and overall health management.  She is concerned about her cholesterol levels, noting an increase from 199 to 196 in total cholesterol since November. She is unsure if this change is related to her current medication, Prozac , which she has been taking. She has a history of borderline cholesterol levels and is considering dietary changes to manage this.  She mentions a weight gain, which she attributes to a desire to gain weight rather than poor eating habits. She is actively engaging in exercise and is conscious of her diet.  She is also concerned about her A1c levels, which are at 5.8, indicating a borderline status. She has a family history of diabetes and stroke, with her father having been diabetic and having had a stroke at 52. She is not diabetic but is aware of her risk factors.  Her eosinophil levels are slightly elevated, and she has not been taking her allergy medication during the summer.  She takes 3000 IU of vitamin D  daily due to osteoporosis concerns, and her vitamin D  levels are on the lowish side but within normal range. She is actively involved in physical activities, cleaning houses, and is mindful of her bone health, engaging in weight-bearing exercises.  She continues in counseling and finds it helpful. She still suffers from anxiety but finds it more manageable  Past Medical History:  Diagnosis Date    social 12/29/2014   Abdominal aortic ectasia (HCC) 12/11/2014   Allergy     Anxiety 10/24/2021   Atrial fibrillation (HCC) 12/11/2014   BCC (basal cell carcinoma), face 12/11/2014   Cervical cancer screening 12/05/2015   History of shingles 01/24/2017   Hx: UTI (urinary tract infection) 12/11/2014   Hyperlipidemia    Mitral valve prolapse    Morbid obesity (HCC) 12/11/2014   Osteoporosis 12/11/2014   Paroxysmal atrial fibrillation (HCC) 12/11/2014   Preventative health care 12/05/2015   Vitamin D  deficiency 12/29/2014    Past Surgical History:  Procedure Laterality Date   CESAREAN SECTION     TONSILLECTOMY      Family History  Problem Relation Age of Onset   Stroke Mother        TIA   Hypertension Mother    Hyperlipidemia Mother    Diabetes Mother        diet controlled   Osteoporosis Mother    Stroke Father    Hypertension Father    Diabetes Father    Hyperlipidemia Father    Mental illness Sister 30       suicide   Heart disease Maternal Grandmother        chf   Osteoporosis Maternal Grandmother    Alcohol abuse Maternal Grandfather    Cancer Paternal Grandmother    Diabetes Paternal Grandmother    Heart disease Paternal Grandfather     Social History   Socioeconomic History   Marital status: Married    Spouse name: Not on file   Number of  children: Not on file   Years of education: Not on file   Highest education level: Not on file  Occupational History   Occupation: clean houses   Tobacco Use   Smoking status: Never   Smokeless tobacco: Never  Vaping Use   Vaping status: Never Used  Substance and Sexual Activity   Alcohol use: Yes    Alcohol/week: 0.0 standard drinks of alcohol    Comment: social.   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, house cleaner, no dietary restrictions  Other Topics Concern   Not on file  Social History Narrative   Not on file   Social Drivers of Health   Financial Resource Strain: Not on file  Food Insecurity: Not on file  Transportation Needs: Not on file   Physical Activity: Not on file  Stress: Not on file  Social Connections: Not on file  Intimate Partner Violence: Not on file    Outpatient Medications Prior to Visit  Medication Sig Dispense Refill   ALPRAZolam  (XANAX ) 0.25 MG tablet Take 1 tablet (0.25 mg total) by mouth 2 (two) times daily as needed for anxiety. 10 tablet 0   Calcium Carbonate (CALCIUM 600 PO) Take 1 tablet by mouth 2 (two) times daily.     Cholecalciferol (VITAMIN D ) 50 MCG (2000 UT) tablet Take 3,000 Units by mouth daily.     FEXOFENADINE HCL PO Take by mouth.     FLUoxetine  (PROZAC ) 20 MG tablet Take 1 tablet (20 mg total) by mouth daily. 90 tablet 1   HYDROcodone  bit-homatropine (HYDROMET) 5-1.5 MG/5ML syrup Take 5 mLs by mouth every 6 (six) hours as needed for cough. 120 mL 0   magnesium (MAGTAB) 84 MG ( ) TBCR SR tablet Take 84 mg by mouth.     Omega-3 Fatty Acids (FISH OIL) 1000 MG CPDR Take 1 capsule by mouth daily.     No facility-administered medications prior to visit.    No Known Allergies  Review of Systems  Constitutional:  Negative for chills, fever and malaise/fatigue.  HENT:  Negative for congestion and hearing loss.   Eyes:  Negative for blurred vision and discharge.  Respiratory:  Negative for cough, sputum production and shortness of breath.   Cardiovascular:  Negative for chest pain, palpitations and leg swelling.  Gastrointestinal:  Negative for abdominal pain, blood in stool, constipation, diarrhea, heartburn, nausea and vomiting.  Genitourinary:  Negative for dysuria, frequency, hematuria and urgency.  Musculoskeletal:  Negative for back pain, falls and myalgias.  Skin:  Negative for rash.  Neurological:  Negative for dizziness, sensory change, loss of consciousness, weakness and headaches.  Endo/Heme/Allergies:  Negative for environmental allergies. Does not bruise/bleed easily.  Psychiatric/Behavioral:  Negative for depression and suicidal ideas. The patient is nervous/anxious. The  patient does not have insomnia.        Objective:    Physical Exam Constitutional:      General: She is not in acute distress.    Appearance: Normal appearance. She is not diaphoretic.  HENT:     Head: Normocephalic and atraumatic.     Right Ear: Tympanic membrane, ear canal and external ear normal.     Left Ear: Tympanic membrane, ear canal and external ear normal.     Nose: Nose normal.     Mouth/Throat:     Mouth: Mucous membranes are moist.     Pharynx: Oropharynx is clear. No oropharyngeal exudate.  Eyes:  General: No scleral icterus.       Right eye: No discharge.        Left eye: No discharge.     Conjunctiva/sclera: Conjunctivae normal.     Pupils: Pupils are equal, round, and reactive to light.  Neck:     Thyroid : No thyromegaly.  Cardiovascular:     Rate and Rhythm: Normal rate and regular rhythm.     Heart sounds: Normal heart sounds. No murmur heard. Pulmonary:     Effort: Pulmonary effort is normal. No respiratory distress.     Breath sounds: Normal breath sounds. No wheezing or rales.  Abdominal:     General: Bowel sounds are normal. There is no distension.     Palpations: Abdomen is soft. There is no mass.     Tenderness: There is no abdominal tenderness.  Musculoskeletal:        General: No tenderness. Normal range of motion.     Cervical back: Normal range of motion and neck supple.  Lymphadenopathy:     Cervical: No cervical adenopathy.  Skin:    General: Skin is warm and dry.     Findings: No rash.  Neurological:     General: No focal deficit present.     Mental Status: She is alert and oriented to person, place, and time.     Cranial Nerves: No cranial nerve deficit.     Coordination: Coordination normal.     Deep Tendon Reflexes: Reflexes are normal and symmetric. Reflexes normal.  Psychiatric:        Mood and Affect: Mood normal.        Behavior: Behavior normal.        Thought Content: Thought content normal.        Judgment: Judgment  normal.     BP 118/72   Pulse 76   Resp 16   Ht 5' 3 (1.6 m)   Wt 112 lb 9.6 oz (51.1 kg)   SpO2 99%   BMI 19.95 kg/m  Wt Readings from Last 3 Encounters:  12/30/23 112 lb 9.6 oz (51.1 kg)  08/06/23 107 lb (48.5 kg)  11/12/22 111 lb 6.4 oz (50.5 kg)    Diabetic Foot Exam - Simple   No data filed    Lab Results  Component Value Date   WBC 4.1 12/23/2023   HGB 13.9 12/23/2023   HCT 42.0 12/23/2023   PLT 239.0 12/23/2023   GLUCOSE 82 12/23/2023   CHOL 231 (H) 12/23/2023   TRIG 64.0 12/23/2023   HDL 76.00 12/23/2023   LDLCALC 142 (H) 12/23/2023   ALT 18 12/23/2023   AST 18 12/23/2023   NA 138 12/23/2023   K 4.0 12/23/2023   CL 103 12/23/2023   CREATININE 0.52 12/23/2023   BUN 13 12/23/2023   CO2 28 12/23/2023   TSH 2.03 12/23/2023   HGBA1C 5.8 12/23/2023    Lab Results  Component Value Date   TSH 2.03 12/23/2023   Lab Results  Component Value Date   WBC 4.1 12/23/2023   HGB 13.9 12/23/2023   HCT 42.0 12/23/2023   MCV 90.5 12/23/2023   PLT 239.0 12/23/2023   Lab Results  Component Value Date   NA 138 12/23/2023   K 4.0 12/23/2023   CO2 28 12/23/2023   GLUCOSE 82 12/23/2023   BUN 13 12/23/2023   CREATININE 0.52 12/23/2023   BILITOT 0.5 12/23/2023   ALKPHOS 52 12/23/2023   AST 18 12/23/2023   ALT 18 12/23/2023   PROT  6.5 12/23/2023   ALBUMIN 4.6 12/23/2023   CALCIUM 8.8 12/23/2023   GFR 97.35 12/23/2023   Lab Results  Component Value Date   CHOL 231 (H) 12/23/2023   Lab Results  Component Value Date   HDL 76.00 12/23/2023   Lab Results  Component Value Date   LDLCALC 142 (H) 12/23/2023   Lab Results  Component Value Date   TRIG 64.0 12/23/2023   Lab Results  Component Value Date   CHOLHDL 3 12/23/2023   Lab Results  Component Value Date   HGBA1C 5.8 12/23/2023       Assessment & Plan:  Hyperglycemia Assessment & Plan: hgba1c acceptable, minimize simple carbs. Increase exercise as tolerated.    Hyperlipidemia,  unspecified hyperlipidemia type Assessment & Plan: Encourage heart healthy diet such as MIND or DASH diet, increase exercise, avoid trans fats, simple carbohydrates and processed foods, consider a krill or fish or flaxseed oil cap daily.     Vitamin D  deficiency Assessment & Plan: Supplement and monitor    Osteoporosis, unspecified osteoporosis type, unspecified pathological fracture presence Assessment & Plan: Encouraged to get adequate exercise, calcium and vitamin d  intake.    Preventative health care Assessment & Plan: Patient encouraged to maintain heart healthy diet, regular exercise, adequate sleep. Consider daily probiotics. Take medications as prescribed. Labs ordered and reviewed. Colonoscopy 2022 repeat in 10 years. Pap 2023. MM 01/2023 repeat this year. dexa 2025 repeat in 2 to 3 years. Discussed need for ACP documements     Assessment and Plan Assessment & Plan Hyperlipidemia Cholesterol increased to 196 mg/dL. Borderline cholesterol without significant risk factors. Discussed genetic component and potential ApoB and LPA testing. - Refer to nutritionist for dietary consultation. - Recheck cholesterol in six months. - Consider ApoB and LPA testing if levels remain elevated.  Hyperglycemia A1c at 5.8%, consistent over two checks. No diabetes diagnosis but family history of diabetes and stroke. Discussed cardiovascular risk with concurrent hyperglycemia and hyperlipidemia. - Refer to nutritionist for dietary consultation. - Recheck A1c in six months.  Osteoporosis Low Vitamin D  despite 3000 IU supplementation. Normal calcium levels. Discussed osteoporosis medications and lifestyle modifications for bone health. - Continue Vitamin D  3000 IU daily. - Consider adding Vitamin K2. - Encourage weight-bearing exercises and balance activities.  Anxiety Continues therapy despite discomfort. Family history of anxiety noted. Discussed importance of mental health support. -  Continue therapy sessions.  Allergic Rhinitis Slightly elevated eosinophil count likely due to allergies. Discussed benefits of restarting allergy medication. - Consider restarting allergy medication.  General Health Maintenance Due for age-related vaccines. Discussed benefits of Prevnar 20 and Arexvy vaccines and importance of proper documentation. - Administer Prevnar 20 vaccine at pharmacy - Administer Arexvy vaccine at pharmacy - Schedule tetanus booster if injured before 2027.  Goals of Care Advance directives printed but not notarized. Discussed notarization options. - Notarize advance directives.  Follow-up Plan to monitor cholesterol and A1c in six months to assess dietary changes' effectiveness. - Schedule lab appointment in six months followed by visit to discuss results.     Harlene Horton, MD

## 2023-12-30 NOTE — Patient Instructions (Addendum)
 Prevnar 20 and Arexvy the RSV shot at pharmacy Tetanus due in 2027 but sooner if injured   Preventive Care 65 Years and Older, Female Preventive care refers to lifestyle choices and visits with your health care provider that can promote health and wellness. Preventive care visits are also called wellness exams. What can I expect for my preventive care visit? Counseling Your health care provider may ask you questions about your: Medical history, including: Past medical problems. Family medical history. Pregnancy and menstrual history. History of falls. Current health, including: Memory and ability to understand (cognition). Emotional well-being. Home life and relationship well-being. Sexual activity and sexual health. Lifestyle, including: Alcohol, nicotine or tobacco, and drug use. Access to firearms. Diet, exercise, and sleep habits. Work and work Astronomer. Sunscreen use. Safety issues such as seatbelt and bike helmet use. Physical exam Your health care provider will check your: Height and weight. These may be used to calculate your BMI (body mass index). BMI is a measurement that tells if you are at a healthy weight. Waist circumference. This measures the distance around your waistline. This measurement also tells if you are at a healthy weight and may help predict your risk of certain diseases, such as type 2 diabetes and high blood pressure. Heart rate and blood pressure. Body temperature. Skin for abnormal spots. What immunizations do I need?  Vaccines are usually given at various ages, according to a schedule. Your health care provider will recommend vaccines for you based on your age, medical history, and lifestyle or other factors, such as travel or where you work. What tests do I need? Screening Your health care provider may recommend screening tests for certain conditions. This may include: Lipid and cholesterol levels. Hepatitis C test. Hepatitis B test. HIV  (human immunodeficiency virus) test. STI (sexually transmitted infection) testing, if you are at risk. Lung cancer screening. Colorectal cancer screening. Diabetes screening. This is done by checking your blood sugar (glucose) after you have not eaten for a while (fasting). Mammogram. Talk with your health care provider about how often you should have regular mammograms. BRCA-related cancer screening. This may be done if you have a family history of breast, ovarian, tubal, or peritoneal cancers. Bone density scan. This is done to screen for osteoporosis. Talk with your health care provider about your test results, treatment options, and if necessary, the need for more tests. Follow these instructions at home: Eating and drinking  Eat a diet that includes fresh fruits and vegetables, whole grains, lean protein, and low-fat dairy products. Limit your intake of foods with high amounts of sugar, saturated fats, and salt. Take vitamin and mineral supplements as recommended by your health care provider. Do not drink alcohol if your health care provider tells you not to drink. If you drink alcohol: Limit how much you have to 0-1 drink a day. Know how much alcohol is in your drink. In the [place] one drink equals one 12 oz bottle of beer (355 mL), one 5 oz glass of wine (148 mL), or one 1 oz glass of hard liquor (44 mL). Lifestyle Brush your teeth every morning and night with fluoride toothpaste. Floss one time each day. Exercise for at least 30 minutes 5 or more days each week. Do not use any products that contain nicotine or tobacco. These products include cigarettes, chewing tobacco, and vaping devices, such as e-cigarettes. If you need help quitting, ask your health care provider. Do not use drugs. If you are sexually active, practice safe sex.  Use a condom or other form of protection in order to prevent STIs. Take aspirin only as told by your health care provider. Make sure that you understand  how much to take and what form to take. Work with your health care provider to find out whether it is safe and beneficial for you to take aspirin daily. Ask your health care provider if you need to take a cholesterol-lowering medicine (statin). Find healthy ways to manage stress, such as: Meditation, yoga, or listening to music. Journaling. Talking to a trusted person. Spending time with friends and family. Minimize exposure to UV radiation to reduce your risk of skin cancer. Safety Always wear your seat belt while driving or riding in a vehicle. Do not drive: If you have been drinking alcohol. Do not ride with someone who has been drinking. When you are tired or distracted. While texting. If you have been using any mind-altering substances or drugs. Wear a helmet and other protective equipment during sports activities. If you have firearms in your house, make sure you follow all gun safety procedures. What's next? Visit your health care provider once a year for an annual wellness visit. Ask your health care provider how often you should have your eyes and teeth checked. Stay up to date on all vaccines. This information is not intended to replace advice given to you by your health care provider. Make sure you discuss any questions you have with your health care provider. Document Revised: 12/07/2020 Document Reviewed: 12/07/2020 Elsevier Patient Education  2024 [employer].

## 2024-01-08 ENCOUNTER — Ambulatory Visit: Admitting: Psychology

## 2024-01-22 ENCOUNTER — Ambulatory Visit (INDEPENDENT_AMBULATORY_CARE_PROVIDER_SITE_OTHER): Admitting: Psychology

## 2024-01-22 DIAGNOSIS — F411 Generalized anxiety disorder: Secondary | ICD-10-CM
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
## 2024-01-31 ENCOUNTER — Other Ambulatory Visit: Payer: Self-pay | Admitting: Family Medicine

## 2024-02-04 ENCOUNTER — Other Ambulatory Visit (HOSPITAL_BASED_OUTPATIENT_CLINIC_OR_DEPARTMENT_OTHER): Payer: Self-pay | Admitting: Family Medicine

## 2024-02-04 DIAGNOSIS — Z1231 Encounter for screening mammogram for malignant neoplasm of breast: Secondary | ICD-10-CM

## 2024-02-05 ENCOUNTER — Ambulatory Visit (INDEPENDENT_AMBULATORY_CARE_PROVIDER_SITE_OTHER): Admitting: Psychology

## 2024-02-05 DIAGNOSIS — F411 Generalized anxiety disorder: Secondary | ICD-10-CM | POA: Diagnosis not present
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
## 2024-02-19 ENCOUNTER — Ambulatory Visit (INDEPENDENT_AMBULATORY_CARE_PROVIDER_SITE_OTHER): Admitting: Psychology

## 2024-02-19 DIAGNOSIS — F411 Generalized anxiety disorder: Secondary | ICD-10-CM

## 2024-02-19 NOTE — Progress Notes (Addendum)
 [facility] Counselor/Therapist Progress Note  Patient ID: Estefanny Moler, MRN: [id number]    Date: 02/19/24  Time Spent: 4:05 pm - 4:58 pm : 53 minutes   Treatment Type: Individual Therapy.  Reported Symptoms:  feeling nervous and on edge, not being able to stop worrying, trouble relaxing, feeling restless, feeling afraid as if something awful might happen, little interest in doing things, feeling down, trouble with sleep, poor appetite, feeling like she's let her family down, and trouble concentrating.   Mental Status Exam: Appearance:  Well Groomed     Behavior: Appropriate  Motor: Normal  Speech/Language:  Normal Rate  Affect: Appropriate  Mood: normal  Thought process: normal  Thought content:   WNL  Sensory/Perceptual disturbances:   WNL  Orientation: oriented to person, place, and time/date  Attention: Good  Concentration: Good  Memory: WNL  Fund of knowledge:  Good  Insight:   Good  Judgment:  Good  Impulse Control: Good   Risk Assessment: Danger to Self:  No Self-injurious Behavior: No Danger to Others: No Duty to Warn:no Physical Aggression / Violence:No  Access to Firearms a concern: No  Gang Involvement:No   Subjective:   [name] participated from home, via video, and consented to treatment. I discussed the limitations of evaluation and management by telemedicine and the availability of in person appointments. The patient expressed understanding and agreed to proceed.  Therapist participated from home office.  Donyea reviewed the events of the past week.   Patient reported that her life is better. Mancel has been her daughter's boyfriend for six months, and patient's daughter's boyfriend was involved in a meeting with patient's ex-husband. That was a trigger for patient and resulted in strong feelings. Patient continues to work on decreasing her worrying.  Interventions: Cognitive Behavioral Therapy  Diagnosis:  Generalized anxiety disorder  [F41.1]   Psychiatric Treatment: Yes , via PCP  Treatment Plan:  Client Abilities/Strengths Krithi is intelligent, self aware, and motivated to change.   Support System: Friends, daughter, and 50 year old mother  Client Treatment Preferences OPT  Client Statement of Needs Orah would like to manage her stress in an effective manner.   Treatment Level Biweekly  Symptoms  Cele experiences symptoms of anxiety and depression.   Goals:   Target Date: 08/25/24 Frequency: Biweekly  Progress: 25% Modality: individual    Therapist will provide referrals for additional resources as appropriate.  Therapist will provide psycho-education regarding Ottilia's diagnosis and corresponding treatment approaches and interventions. [name] will support the patient's ability to achieve the goals identified. will employ CBT, BA, Problem-solving, Solution Focused, Mindfulness,  coping skills, & other evidenced-based practices will be used to promote progress towards healthy functioning to help manage decrease symptoms associated with their diagnosis.   Reduce overall level, frequency, and intensity of the feelings of depression, anxiety and panic evidenced by decreased overall symptom from 6 to 7 days/week to 0 to 1 days/week per client report for at least 3 consecutive months. Verbally express understanding of the relationship between feelings of depression and anxiety and their impact on thinking patterns and behaviors. Verbalize an understanding of the role that distorted thinking plays in creating fears, excessive worry, and ruminations.  (Kadra participated in the creation of the treatment plan)    [name]

## 2024-02-20 ENCOUNTER — Ambulatory Visit (HOSPITAL_BASED_OUTPATIENT_CLINIC_OR_DEPARTMENT_OTHER)
Admission: RE | Admit: 2024-02-20 | Discharge: 2024-02-20 | Disposition: A | Discharge status: Home / Self Care | Source: Ambulatory Visit | Attending: Family Medicine | Admitting: Family Medicine

## 2024-02-20 ENCOUNTER — Encounter (HOSPITAL_BASED_OUTPATIENT_CLINIC_OR_DEPARTMENT_OTHER): Payer: Self-pay

## 2024-02-20 ENCOUNTER — Inpatient Hospital Stay (HOSPITAL_BASED_OUTPATIENT_CLINIC_OR_DEPARTMENT_OTHER): Admission: RE | Admit: 2024-02-20 | Source: Ambulatory Visit

## 2024-02-20 DIAGNOSIS — Z1231 Encounter for screening mammogram for malignant neoplasm of breast: Secondary | ICD-10-CM | POA: Diagnosis not present

## 2024-03-04 ENCOUNTER — Ambulatory Visit (INDEPENDENT_AMBULATORY_CARE_PROVIDER_SITE_OTHER): Admitting: Psychology

## 2024-03-04 DIAGNOSIS — F411 Generalized anxiety disorder: Secondary | ICD-10-CM | POA: Diagnosis not present

## 2024-03-04 NOTE — Progress Notes (Signed)
 [facility] Counselor/Therapist Progress Note  Patient ID: Catherine Haynes, MRN: [id number]    Date: 03/04/24  Time Spent: 4:00 pm- 4:53 pm : 53 minutes  Treatment Type: Individual Therapy.  Reported Symptoms: feeling nervous and on edge, not being able to stop worrying, trouble relaxing, feeling restless, feeling afraid as if something awful might happen, little interest in doing things, feeling down, trouble with sleep, poor appetite, feeling like she's let her family down, and trouble concentrating.     Mental Status Exam: Appearance:  Well Groomed     Behavior: Appropriate  Motor: Normal  Speech/Language:  Normal Rate  Affect: Appropriate  Mood: normal  Thought process: normal  Thought content:   WNL  Sensory/Perceptual disturbances:   WNL  Orientation: oriented to person, place, and time/date  Attention: Good  Concentration: Good  Memory: WNL  Fund of knowledge:  Good  Insight:   Good  Judgment:  Good  Impulse Control: Good   Risk Assessment: Danger to Self:  No Self-injurious Behavior: No Danger to Others: No Duty to Warn:no Physical Aggression / Violence:No  Access to Firearms a concern: No  Gang Involvement:No   Subjective:   [name] participated from home, via video, and consented to treatment. I discussed the limitations of evaluation and management by telemedicine and the availability of in person appointments. The patient expressed understanding and agreed to proceed.  Therapist participated from home office.  Daryan reviewed the events of the past week.   Patient reported her thoughts with regard to closure of our therapy sessions for the time being. We reviewed the past goals that patient had set for herself, and we planned goals for the future. Patient will reach out to this writer in the future whenever it would be helpful to her.   Interventions: Cognitive Behavioral Therapy  Diagnosis: Generalized anxiety disorder [F41.1]    Psychiatric Treatment: Yes , via PCP  Treatment Plan:  Client Abilities/Strengths Bridey is intelligent, self-aware, and motivated  Support System: Friend, daughter, 7 year old mother.  Client Treatment Preferences OPT  Client Statement of Needs Oceanna would like to continue to manage her stress in a healthy.   Treatment Level As needed  Symptoms  Not being able to stop worrying, trouble relaxing, feeling restless, feeling afraid as if something awful might happen (Status improved) Feeling nervous and on edge (Status maintained)  Goals:   Target Date: 08/25/24 Frequency: As needed  Progress: 50% Modality: individual    Therapist will provide referrals for additional resources as appropriate.  Therapist will provide psycho-education regarding Chandrea's diagnosis and corresponding treatment approaches and interventions. [name] will support the patient's ability to achieve the goals identified. will employ CBT, BA, Problem-solving, Solution Focused, Mindfulness,  coping skills, & other evidenced-based practices will be used to promote progress towards healthy functioning to help manage decrease symptoms associated with their diagnosis.   Reduce overall level, frequency, and intensity of the feelings of depression, anxiety and panic evidenced by decreased overall symptoms from 6 to 7 days/week to 0 to 1 days/week per client report for at least 3 consecutive months. Verbally express understanding of the relationship between feelings of,anxiety and their impact on thinking patterns and behaviors. Verbalize an understanding of the role that distorted thinking plays in creating fears, excessive worry, and ruminations.  (Adama participated in the creation of the treatment plan)    [name]

## 2024-03-06 ENCOUNTER — Telehealth: Payer: Self-pay

## 2024-03-06 ENCOUNTER — Other Ambulatory Visit (HOSPITAL_BASED_OUTPATIENT_CLINIC_OR_DEPARTMENT_OTHER): Payer: Self-pay

## 2024-03-06 MED ORDER — COVID-19 MRNA VAC-TRIS(PFIZER) 30 MCG/0.3ML IM SUSY
0.3000 mL | PREFILLED_SYRINGE | Freq: Once | INTRAMUSCULAR | 0 refills | Status: AC
  Filled 2024-03-06 – 2024-03-13 (×2): qty 0.3, 1d supply, fill #0

## 2024-03-06 NOTE — Telephone Encounter (Signed)
 Copied from CRM [id number]. Topic: Clinical - Medication Question >> Mar 06, 2024  9:25 AM Rea ORN wrote: Reason for CRM: Pt requesting a Covid vaccine rx to be seen to [facility] [address]

## 2024-03-10 ENCOUNTER — Other Ambulatory Visit (HOSPITAL_BASED_OUTPATIENT_CLINIC_OR_DEPARTMENT_OTHER): Payer: Self-pay

## 2024-03-13 ENCOUNTER — Other Ambulatory Visit (HOSPITAL_BASED_OUTPATIENT_CLINIC_OR_DEPARTMENT_OTHER): Payer: Self-pay

## 2024-03-13 MED ORDER — FLUZONE HIGH-DOSE 0.5 ML IM SUSY
0.5000 mL | PREFILLED_SYRINGE | Freq: Once | INTRAMUSCULAR | 0 refills | Status: AC
  Filled 2024-03-13: qty 0.5, 1d supply, fill #0

## 2024-03-18 ENCOUNTER — Ambulatory Visit: Admitting: Psychology

## 2024-04-01 ENCOUNTER — Ambulatory Visit: Admitting: Psychology

## 2024-04-15 ENCOUNTER — Ambulatory Visit: Admitting: Psychology

## 2024-04-29 ENCOUNTER — Ambulatory Visit: Admitting: Psychology

## 2024-06-26 ENCOUNTER — Other Ambulatory Visit (INDEPENDENT_AMBULATORY_CARE_PROVIDER_SITE_OTHER)

## 2024-06-26 DIAGNOSIS — M810 Age-related osteoporosis without current pathological fracture: Secondary | ICD-10-CM | POA: Diagnosis not present

## 2024-06-26 DIAGNOSIS — E785 Hyperlipidemia, unspecified: Secondary | ICD-10-CM

## 2024-06-26 DIAGNOSIS — R739 Hyperglycemia, unspecified: Secondary | ICD-10-CM | POA: Diagnosis not present

## 2024-06-26 DIAGNOSIS — E559 Vitamin D deficiency, unspecified: Secondary | ICD-10-CM | POA: Diagnosis not present

## 2024-06-26 LAB — COMPREHENSIVE METABOLIC PANEL WITH GFR
ALT: 27 U/L (ref 3–35)
AST: 23 U/L (ref 5–37)
Albumin: 4.3 g/dL (ref 3.5–5.2)
Alkaline Phosphatase: 61 U/L (ref 39–117)
BUN: 15 mg/dL (ref 6–23)
CO2: 30 meq/L (ref 19–32)
Calcium: 8.8 mg/dL (ref 8.4–10.5)
Chloride: 103 meq/L (ref 96–112)
Creatinine, Ser: 0.62 mg/dL (ref 0.40–1.20)
GFR: 92.98 mL/min
Glucose, Bld: 82 mg/dL (ref 70–99)
Potassium: 4.3 meq/L (ref 3.5–5.1)
Sodium: 139 meq/L (ref 135–145)
Total Bilirubin: 0.5 mg/dL (ref 0.2–1.2)
Total Protein: 6.4 g/dL (ref 6.0–8.3)

## 2024-06-26 LAB — CBC WITH DIFFERENTIAL/PLATELET
Basophils Absolute: 0 K/uL (ref 0.0–0.1)
Basophils Relative: 0.9 % (ref 0.0–3.0)
Eosinophils Absolute: 0.4 K/uL (ref 0.0–0.7)
Eosinophils Relative: 8.3 % — ABNORMAL HIGH (ref 0.0–5.0)
HCT: 41.4 % (ref 36.0–46.0)
Hemoglobin: 13.7 g/dL (ref 12.0–15.0)
Lymphocytes Relative: 22.1 % (ref 12.0–46.0)
Lymphs Abs: 1 K/uL (ref 0.7–4.0)
MCHC: 33.2 g/dL (ref 30.0–36.0)
MCV: 91.9 fl (ref 78.0–100.0)
Monocytes Absolute: 0.5 K/uL (ref 0.1–1.0)
Monocytes Relative: 10.1 % (ref 3.0–12.0)
Neutro Abs: 2.7 K/uL (ref 1.4–7.7)
Neutrophils Relative %: 58.6 % (ref 43.0–77.0)
Platelets: 251 K/uL (ref 150.0–400.0)
RBC: 4.5 Mil/uL (ref 3.87–5.11)
RDW: 13.4 % (ref 11.5–15.5)
WBC: 4.5 K/uL (ref 4.0–10.5)

## 2024-06-26 LAB — LIPID PANEL
Cholesterol: 232 mg/dL — ABNORMAL HIGH (ref 28–200)
HDL: 83 mg/dL
LDL Cholesterol: 137 mg/dL — ABNORMAL HIGH (ref 10–99)
NonHDL: 148.56
Total CHOL/HDL Ratio: 3
Triglycerides: 60 mg/dL (ref 10.0–149.0)
VLDL: 12 mg/dL (ref 0.0–40.0)

## 2024-06-26 LAB — HEMOGLOBIN A1C: Hgb A1c MFr Bld: 5.5 % (ref 4.6–6.5)

## 2024-06-26 LAB — VITAMIN D 25 HYDROXY (VIT D DEFICIENCY, FRACTURES): VITD: 32.83 ng/mL (ref 30.00–100.00)

## 2024-06-26 LAB — TSH: TSH: 2.81 u[IU]/mL (ref 0.35–5.50)

## 2024-06-27 ENCOUNTER — Ambulatory Visit: Payer: Self-pay | Admitting: Family Medicine

## 2024-06-28 NOTE — Assessment & Plan Note (Signed)
 Supplement and monitor

## 2024-06-28 NOTE — Assessment & Plan Note (Signed)
 Encouraged to get adequate exercise, calcium and vitamin d intake

## 2024-06-28 NOTE — Assessment & Plan Note (Signed)
 Encourage heart healthy diet such as MIND or DASH diet, increase exercise, avoid trans fats, simple carbohydrates and processed foods, consider a krill or fish or flaxseed oil cap daily.

## 2024-06-28 NOTE — Progress Notes (Signed)
 "  Subjective:    Patient ID: Catherine Haynes, female    DOB: 1956-05-13, 69 y.o.   MRN: [id number]  No chief complaint on file.   HPI Discussed the use of AI scribe software for clinical note transcription with the patient, who gave verbal consent to proceed.  History of Present Illness Catherine Haynes is a 69 year old female who presents for follow-up on cholesterol management and diabetes monitoring.  She has a history of hyperlipidemia, which has been challenging to manage despite maintaining a healthy diet and lifestyle.   She is monitoring her diabetes, with a recent A1c of 5.5, showing improvement from a previous 5.8. She is concerned about the combined risk of high cholesterol and diabetes.  Her family history includes high cholesterol, breast cancer, and blood clots. She has avoided hormone therapy due to concerns about breast cancer and blood clots, which are prevalent in her family.  She has received vaccinations for flu, RSV, pneumonia, and COVID. She is considering discontinuing Prozac , which she has been taking for a year following her recent divorce.  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 No anemia, no kidney trouble, eosinophils elevated likely due to allergies, no issues with thyroid  function.    Past Medical History:  Diagnosis Date    social 12/29/2014   Abdominal aortic ectasia 12/11/2014   Allergy    Anxiety 10/24/2021   Atrial fibrillation (HCC) 12/11/2014   BCC (basal cell carcinoma), face 12/11/2014   Cervical cancer screening 12/05/2015   History of shingles 01/24/2017   Hx: UTI (urinary tract infection) 12/11/2014   Hyperlipidemia    Mitral valve prolapse    Morbid obesity (HCC) 12/11/2014   Osteoporosis 12/11/2014   Paroxysmal atrial fibrillation (HCC) 12/11/2014   Preventative health care 12/05/2015   Vitamin D  deficiency 12/29/2014    Past Surgical History:  Procedure Laterality Date   CESAREAN SECTION     TONSILLECTOMY       Family History  Problem Relation Age of Onset   Stroke Mother        TIA   Hypertension Mother    Hyperlipidemia Mother    Diabetes Mother        diet controlled   Osteoporosis Mother    Stroke Father    Hypertension Father    Diabetes Father    Hyperlipidemia Father    Mental illness Sister 30       suicide   Heart disease Maternal Grandmother        chf   Osteoporosis Maternal Grandmother    Alcohol abuse Maternal Grandfather    Cancer Paternal Grandmother    Diabetes Paternal Grandmother    Heart disease Paternal Grandfather     Social History   Socioeconomic History   Marital status: Married    Spouse name: Not on file   Number of children: Not on file   Years of education: Not on file   Highest education level: Not on file  Occupational History   Occupation: clean houses   Tobacco Use   Smoking status: Never   Smokeless tobacco: Never  Vaping Use   Vaping status: Never Used  Substance and Sexual Activity   Alcohol use: Yes    Alcohol/week: 0.0 standard drinks of alcohol    Comment: social.   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, house cleaner, no dietary restrictions  Other Topics Concern   Not on  file  Social History Narrative   Not on file   Social Drivers of Health   Tobacco Use: Low Risk (12/30/2023)   Patient History    Smoking Tobacco Use: Never    Smokeless Tobacco Use: Never    Passive Exposure: Not on file  Financial Resource Strain: Not on file  Food Insecurity: Not on file  Transportation Needs: Not on file  Physical Activity: Not on file  Stress: Not on file  Social Connections: Not on file  Intimate Partner Violence: Not on file  Depression (PHQ2-9): Low Risk (12/30/2023)   Depression (PHQ2-9)    PHQ-2 Score: 0  Alcohol Screen: Not on file  Housing: Not on file  Utilities: Not on file  Health Literacy: Not on file    Outpatient Medications Prior to Visit  Medication Sig Dispense  Refill   ALPRAZolam  (XANAX ) 0.25 MG tablet Take 1 tablet (0.25 mg total) by mouth 2 (two) times daily as needed for anxiety. 10 tablet 0   Calcium Carbonate (CALCIUM 600 PO) Take 1 tablet by mouth 2 (two) times daily.     Cholecalciferol (VITAMIN D ) 50 MCG (2000 UT) tablet Take 3,000 Units by mouth daily.     FEXOFENADINE HCL PO Take by mouth.     FLUoxetine  (PROZAC ) 20 MG tablet TAKE 1 TABLET BY MOUTH EVERY DAY 90 tablet 1   HYDROcodone  bit-homatropine (HYDROMET) 5-1.5 MG/5ML syrup Take 5 mLs by mouth every 6 (six) hours as needed for cough. 120 mL 0   magnesium (MAGTAB) 84 MG ( ) TBCR SR tablet Take 84 mg by mouth.     Omega-3 Fatty Acids (FISH OIL) 1000 MG CPDR Take 1 capsule by mouth daily.     No facility-administered medications prior to visit.    Allergies[1]  Review of Systems  Constitutional:  Negative for fever and malaise/fatigue.  HENT:  Negative for congestion.   Eyes:  Negative for blurred vision.  Respiratory:  Negative for shortness of breath.   Cardiovascular:  Negative for chest pain, palpitations and leg swelling.  Gastrointestinal:  Negative for abdominal pain, blood in stool and nausea.  Genitourinary:  Negative for dysuria and frequency.  Musculoskeletal:  Negative for falls.  Skin:  Negative for rash.  Neurological:  Negative for dizziness, loss of consciousness and headaches.  Endo/Heme/Allergies:  Negative for environmental allergies.  Psychiatric/Behavioral:  Negative for depression. The patient is not nervous/anxious.        Objective:    Physical Exam Constitutional:      General: She is not in acute distress.    Appearance: Normal appearance. She is well-developed. She is not toxic-appearing.  HENT:     Head: Normocephalic and atraumatic.     Right Ear: External ear normal.     Left Ear: External ear normal.     Nose: Nose normal.  Eyes:     General:        Right eye: No discharge.        Left eye: No discharge.     Conjunctiva/sclera:  Conjunctivae normal.  Neck:     Thyroid : No thyromegaly.  Cardiovascular:     Rate and Rhythm: Normal rate and regular rhythm.     Heart sounds: Normal heart sounds. No murmur heard. Pulmonary:     Effort: Pulmonary effort is normal. No respiratory distress.     Breath sounds: Normal breath sounds.  Abdominal:     General: Bowel sounds are normal.     Palpations: Abdomen is soft.  Tenderness: There is no abdominal tenderness. There is no guarding.  Musculoskeletal:        General: Normal range of motion.     Cervical back: Neck supple.  Lymphadenopathy:     Cervical: No cervical adenopathy.  Skin:    General: Skin is warm and dry.  Neurological:     Mental Status: She is alert and oriented to person, place, and time.  Psychiatric:        Mood and Affect: Mood normal.        Behavior: Behavior normal.        Thought Content: Thought content normal.        Judgment: Judgment normal.    There were no vitals taken for this visit. Wt Readings from Last 3 Encounters:  12/30/23 112 lb 9.6 oz (51.1 kg)  08/06/23 107 lb (48.5 kg)  11/12/22 111 lb 6.4 oz (50.5 kg)    Diabetic Foot Exam - Simple   No data filed    Lab Results  Component Value Date   WBC 4.5 06/26/2024   HGB 13.7 06/26/2024   HCT 41.4 06/26/2024   PLT 251.0 06/26/2024   GLUCOSE 82 06/26/2024   CHOL 232 (H) 06/26/2024   TRIG 60.0 06/26/2024   HDL 83.00 06/26/2024   LDLCALC 137 (H) 06/26/2024   ALT 27 06/26/2024   AST 23 06/26/2024   NA 139 06/26/2024   K 4.3 06/26/2024   CL 103 06/26/2024   CREATININE 0.62 06/26/2024   BUN 15 06/26/2024   CO2 30 06/26/2024   TSH 2.81 06/26/2024   HGBA1C 5.5 06/26/2024    Lab Results  Component Value Date   TSH 2.81 06/26/2024   Lab Results  Component Value Date   WBC 4.5 06/26/2024   HGB 13.7 06/26/2024   HCT 41.4 06/26/2024   MCV 91.9 06/26/2024   PLT 251.0 06/26/2024   Lab Results  Component Value Date   NA 139 06/26/2024   K 4.3 06/26/2024   CO2  30 06/26/2024   GLUCOSE 82 06/26/2024   BUN 15 06/26/2024   CREATININE 0.62 06/26/2024   BILITOT 0.5 06/26/2024   ALKPHOS 61 06/26/2024   AST 23 06/26/2024   ALT 27 06/26/2024   PROT 6.4 06/26/2024   ALBUMIN 4.3 06/26/2024   CALCIUM 8.8 06/26/2024   GFR 92.98 06/26/2024   Lab Results  Component Value Date   CHOL 232 (H) 06/26/2024   Lab Results  Component Value Date   HDL 83.00 06/26/2024   Lab Results  Component Value Date   LDLCALC 137 (H) 06/26/2024   Lab Results  Component Value Date   TRIG 60.0 06/26/2024   Lab Results  Component Value Date   CHOLHDL 3 06/26/2024   Lab Results  Component Value Date   HGBA1C 5.5 06/26/2024       Assessment & Plan:  Vitamin D  deficiency Assessment & Plan: Supplement and monitor    Osteoporosis, unspecified osteoporosis type, unspecified pathological fracture presence Assessment & Plan: Encouraged to get adequate exercise, calcium and vitamin d  intake.    Hyperlipidemia, unspecified hyperlipidemia type Assessment & Plan: Encourage heart healthy diet such as MIND or DASH diet, increase exercise, avoid trans fats, simple carbohydrates and processed foods, consider a krill or fish or flaxseed oil cap daily.     Hyperglycemia Assessment & Plan: hgba1c acceptable, minimize simple carbs. Increase exercise as tolerated.    Abnormal TSH Assessment & Plan: monitor     Assessment and Plan Assessment & Plan  Hyperlipidemia Cholesterol levels are elevated but not at a level requiring immediate treatment. LDL has decreased slightly, total cholesterol has increased slightly, and HDL has increased. Long-term cardiac risk is low, and treatment is not indicated at this time. Post-menopausal status may contribute to cholesterol levels. - Continue current management and lifestyle modifications. - Ordered blood work to monitor cholesterol levels.  Hyperglycemia A1c has improved from 5.8 to 5.5, indicating good control of blood  glucose levels. No immediate concerns with current management. - Continue current management and lifestyle modifications.  Osteoporosis Bone density is normal as per recent DEXA scan. No current need for hormone therapy due to normal bone density and concerns about risks associated with hormone therapy, including increased risk of blood clots, DVTs, PEs, strokes, and heart attacks. - Continue current management and lifestyle modifications.  Anxiety Currently managed with fluoxetine  (Prozac ). Considering tapering off medication after a year of use. Discussed tapering strategy and potential for resuming medication if needed. - Taper fluoxetine  by halving the dose for a few weeks, then half every other day for a few more weeks, and discontinue if stable. - Monitor for any increase in anxiety symptoms and resume medication if necessary.  General Health Maintenance Up to date on flu, RSV, pneumonia, and COVID vaccinations. Tetanus vaccination is due in 2027. Discussed the importance of maintaining vaccinations and the potential need for early tetanus vaccination if injured. - Continue routine vaccinations as per schedule. - Consider early tetanus vaccination if injured.  Recording duration: 23 minutes     Harlene Horton, MD    [1] No Known Allergies  "

## 2024-06-28 NOTE — Assessment & Plan Note (Signed)
 hgba1c acceptable, minimize simple carbs. Increase exercise as tolerated.

## 2024-06-28 NOTE — Assessment & Plan Note (Signed)
 monitor

## 2024-06-29 NOTE — Progress Notes (Signed)
"  Pt reviewed via MyChart.   "

## 2024-07-02 ENCOUNTER — Ambulatory Visit: Admitting: Family Medicine

## 2024-07-02 ENCOUNTER — Encounter: Payer: Self-pay | Admitting: Family Medicine

## 2024-07-02 VITALS — BP 130/82 | HR 82 | Temp 98.3°F | Resp 16 | Ht 63.0 in | Wt 116.6 lb

## 2024-07-02 DIAGNOSIS — M810 Age-related osteoporosis without current pathological fracture: Secondary | ICD-10-CM | POA: Diagnosis not present

## 2024-07-02 DIAGNOSIS — R7989 Other specified abnormal findings of blood chemistry: Secondary | ICD-10-CM

## 2024-07-02 DIAGNOSIS — E559 Vitamin D deficiency, unspecified: Secondary | ICD-10-CM

## 2024-07-02 DIAGNOSIS — R739 Hyperglycemia, unspecified: Secondary | ICD-10-CM

## 2024-07-02 DIAGNOSIS — E785 Hyperlipidemia, unspecified: Secondary | ICD-10-CM | POA: Diagnosis not present

## 2024-07-02 NOTE — Patient Instructions (Signed)
Eating Plan for Osteoporosis Osteoporosis causes your bones to become weak and brittle. This puts you at greater risk for bone breaks (fractures) from small bumps or falls. Making changes to your diet and increasing your physical activity can help strengthen your bones and improve your overall health. Calcium and vitamin D are nutrients that play an important role in bone health. Vitamin D helps your body use calcium and strengthen bones. It is important to get enough calcium and vitamin D as part of your eating plan for osteoporosis. What are tips for following this plan? Reading food labels Try to get at least 1,000 milligrams (mg) of calcium each day. Look for foods that have at least 50 mg of calcium per serving. Talk with your health care provider about taking a calcium supplement if you do not get enough calcium from food. Do not have more than 2,500 mg of calcium each day. This is the upper limit for food and nutritional supplements combined. Too much calcium may cause constipation and prevent you from absorbing other important nutrients. Choose foods that contain vitamin D. Take a daily vitamin supplement that contains 800-1,000 international units (IU) of vitamin D. The amount may be different depending on your age, body weight, and where you live. Talk with your dietitian or health care provider about how much vitamin D is right for you. Avoid foods that have more than 300 mg of sodium per serving. Too much sodium can cause your body to lose calcium. Talk with your dietitian or health care provider about how much sodium you are allowed each day. Shopping Do not buy foods with added salt, including: Salted snacks. Rosita [employer]. Canned soups. Canned meats. Processed meats, such as bacon or precooked or cured meat like sausages or meat loaves. Smoked fish. Meal planning Eat balanced meals that contain protein foods, fruits and vegetables, and foods rich in calcium and vitamin D. Eat at least  5 servings of fruits and vegetables each day. Eat 5-6 oz (142-170 g) of lean meat, poultry, fish, eggs, or beans each day. Lifestyle Do not use any products that contain nicotine or tobacco, such as cigarettes, e-cigarettes, and chewing tobacco. If you need help quitting, ask your health care provider. If your health care provider recommends that you lose weight: Work with a dietitian to develop an eating plan that will help you reach your desired weight goal. Exercise for at least 30 minutes a day, 5 or more days a week, or as told by your health care provider. Work with a physical therapist to develop an exercise plan that includes flexibility, balance, and strength exercises. Do not focus only on aerobic exercise. Do not drink alcohol if: Your health care provider tells you not to drink. You are pregnant, may be pregnant, or are planning to become pregnant. If you drink alcohol: Limit how much you use to: 0-1 drink a day for women. 0-2 drinks a day for men. Be aware of how much alcohol is in your drink. In the [place] one drink equals one 12 oz bottle of beer (355 mL), one 5 oz glass of wine (148 mL), or one 1 oz glass of hard liquor (44 mL). What foods should I eat? Foods high in calcium  Yogurt. Yogurt with fruit. Milk. Evaporated skim milk. Dry milk powder. Calcium-fortified orange juice. Parmesan cheese. Part-skim ricotta cheese. Natural hard cheese. Cream cheese. Cottage cheese. Canned sardines. Canned salmon. Calcium-treated tofu. Calcium-fortified cereal bar. Calcium-fortified cereal. Calcium-fortified graham crackers. Cooked collard greens. Turnip greens. Broccoli.  Kale. Almonds. White beans. Corn tortilla. Foods high in vitamin D Cod liver oil. Fatty fish, such as tuna, mackerel, and salmon. Milk. Fortified soy milk. Fortified fruit juice. Yogurt. Margarine. Egg yolks. Foods high in protein Beef. Lamb. Pork tenderloin. Chicken breast. Tuna (canned). Fish  fillet. Tofu. Cooked soy beans. Soy patty. Beans (canned or cooked). Cottage cheese. Yogurt. Peanut butter. Pumpkin seeds. Nuts. Sunflower seeds. Hard cheese. Milk or other milk products, such as soy milk. The items listed above may not be a complete list of foods and beverages you can eat. Contact a dietitian for more options. Summary Calcium and vitamin D are nutrients that play an important role in bone health and are an important part of your eating plan for osteoporosis. Eat balanced meals that contain protein foods, fruits and vegetables, and foods rich in calcium and vitamin D. Avoid foods that have more than 300 mg of sodium per serving. Too much sodium can cause your body to lose calcium. Exercise is an important part of prevention and treatment of osteoporosis. Aim for at least 30 minutes a day, 5 days a week. This information is not intended to replace advice given to you by your health care provider. Make sure you discuss any questions you have with your health care provider. Document Revised: 11/26/2019 Document Reviewed: 11/26/2019 Elsevier Patient Education  2024 [employer].

## 2024-07-15 ENCOUNTER — Telehealth: Payer: Self-pay

## 2024-07-15 NOTE — Telephone Encounter (Signed)
 Copied from CRM #[id number]. Topic: General - Other >> Jul 15, 2024  2:39 PM Tonda B wrote: Reason for CRM: patient is calling has questions about her seeing a new provider please call pt [phone]

## 2024-07-15 NOTE — Telephone Encounter (Signed)
 Tried calling Pt- phone goes straight to vm, voicemail box is full.

## 2025-01-11 ENCOUNTER — Other Ambulatory Visit

## 2025-01-21 ENCOUNTER — Encounter: Admitting: Family Medicine
